# Patient Record
Sex: Male | Born: 1980 | Race: White | Hispanic: No | Marital: Single | Smoking: Former smoker
Health system: Southern US, Community
[De-identification: ages and names within clinical notes are randomized; demographics above are authoritative.]

## PROBLEM LIST (undated history)

## (undated) DIAGNOSIS — F3181 Bipolar II disorder: Secondary | ICD-10-CM

## (undated) DIAGNOSIS — G4730 Sleep apnea, unspecified: Secondary | ICD-10-CM

## (undated) DIAGNOSIS — F909 Attention-deficit hyperactivity disorder, unspecified type: Secondary | ICD-10-CM

## (undated) DIAGNOSIS — H5440 Blindness, one eye, unspecified eye: Secondary | ICD-10-CM

## (undated) HISTORY — PX: EYE SURGERY: SHX253

## (undated) HISTORY — PX: SPINE SURGERY: SHX786

---

## 2008-11-08 HISTORY — PX: INCISE AND DRAIN ABCESS: PRO64

## 2011-08-17 ENCOUNTER — Emergency Department (HOSPITAL_COMMUNITY)
Admission: EM | Admit: 2011-08-17 | Discharge: 2011-08-18 | Disposition: A | Discharge status: Home / Self Care | Payer: Managed Care, Other (non HMO) | Attending: Emergency Medicine | Admitting: Emergency Medicine

## 2011-08-17 DIAGNOSIS — K089 Disorder of teeth and supporting structures, unspecified: Secondary | ICD-10-CM | POA: Insufficient documentation

## 2011-08-17 DIAGNOSIS — F319 Bipolar disorder, unspecified: Secondary | ICD-10-CM | POA: Insufficient documentation

## 2013-01-25 ENCOUNTER — Telehealth: Payer: Self-pay | Admitting: Neurology

## 2013-01-25 NOTE — Telephone Encounter (Signed)
Called the patient to schedule follow up appt with you as requested to go over sleep study results.  He is concerned about having an additional copayment.  Did let him know that no significant sleep apnea was found based on the results.  Are there any recommendations that you can make for him that would not require him to come into the office?

## 2013-01-29 NOTE — Telephone Encounter (Signed)
Please call patient and advised him that it is always better to come in and discuss sleep study findings face to face. He did have some periodic leg movements of sleep and may benefit from treatment. We can delay his appointment until he is able to come in if he wishes, I do not believe he needs to come in urgently but would like for him to come in eventually. Please relay back the patient, thanks Mitchell Ray

## 2013-02-01 ENCOUNTER — Encounter: Payer: Self-pay | Admitting: Neurology

## 2015-05-19 ENCOUNTER — Emergency Department (HOSPITAL_COMMUNITY)
Admission: EM | Admit: 2015-05-19 | Discharge: 2015-05-19 | Discharge status: Against Medical Advice | Payer: Managed Care, Other (non HMO) | Attending: Emergency Medicine | Admitting: Emergency Medicine

## 2015-05-19 ENCOUNTER — Encounter (HOSPITAL_COMMUNITY): Payer: Self-pay | Admitting: *Deleted

## 2015-05-19 DIAGNOSIS — Z720 Tobacco use: Secondary | ICD-10-CM | POA: Insufficient documentation

## 2015-05-19 DIAGNOSIS — R200 Anesthesia of skin: Secondary | ICD-10-CM | POA: Insufficient documentation

## 2015-05-19 DIAGNOSIS — H5441 Blindness, right eye, normal vision left eye: Secondary | ICD-10-CM | POA: Insufficient documentation

## 2015-05-19 HISTORY — DX: Blindness, one eye, unspecified eye: H54.40

## 2015-05-19 HISTORY — DX: Bipolar II disorder: F31.81

## 2015-05-19 HISTORY — DX: Attention-deficit hyperactivity disorder, unspecified type: F90.9

## 2015-05-19 NOTE — ED Notes (Signed)
Pt reports left finger numbness 3-4 months ago, right arm numbness and difficulty walking about a month ago. Pt denies any loss of motor function but reports some difficulty typing with left hand. Pt states when he laughs hard, coughs, picks up objects his arms have a shooting pain in his arms. Pt denies any recent injury to arms or legs. Pt states he feels like his right leg is dragging behind him.

## 2015-05-21 ENCOUNTER — Emergency Department (HOSPITAL_COMMUNITY)
Admission: EM | Admit: 2015-05-21 | Discharge: 2015-05-21 | Disposition: A | Discharge status: Home / Self Care | Payer: Self-pay | Attending: Emergency Medicine | Admitting: Emergency Medicine

## 2015-05-21 ENCOUNTER — Emergency Department (HOSPITAL_COMMUNITY): Payer: Self-pay

## 2015-05-21 ENCOUNTER — Encounter (HOSPITAL_COMMUNITY): Payer: Self-pay | Admitting: Cardiology

## 2015-05-21 DIAGNOSIS — R531 Weakness: Secondary | ICD-10-CM | POA: Insufficient documentation

## 2015-05-21 DIAGNOSIS — R269 Unspecified abnormalities of gait and mobility: Secondary | ICD-10-CM | POA: Insufficient documentation

## 2015-05-21 DIAGNOSIS — Z8659 Personal history of other mental and behavioral disorders: Secondary | ICD-10-CM | POA: Insufficient documentation

## 2015-05-21 DIAGNOSIS — R202 Paresthesia of skin: Secondary | ICD-10-CM | POA: Insufficient documentation

## 2015-05-21 DIAGNOSIS — Z720 Tobacco use: Secondary | ICD-10-CM | POA: Insufficient documentation

## 2015-05-21 DIAGNOSIS — H5441 Blindness, right eye, normal vision left eye: Secondary | ICD-10-CM | POA: Insufficient documentation

## 2015-05-21 DIAGNOSIS — Z791 Long term (current) use of non-steroidal anti-inflammatories (NSAID): Secondary | ICD-10-CM | POA: Insufficient documentation

## 2015-05-21 LAB — BASIC METABOLIC PANEL
ANION GAP: 6 (ref 5–15)
BUN: 16 mg/dL (ref 6–20)
CO2: 27 mmol/L (ref 22–32)
Calcium: 9.6 mg/dL (ref 8.9–10.3)
Chloride: 106 mmol/L (ref 101–111)
Creatinine, Ser: 1 mg/dL (ref 0.61–1.24)
GFR calc Af Amer: >60 mL/min (ref >60)
GLUCOSE: 100 mg/dL — AB (ref 65–99)
POTASSIUM: 4.5 mmol/L (ref 3.5–5.1)
SODIUM: 139 mmol/L (ref 135–145)

## 2015-05-21 LAB — CBC
HCT: 45.1 % (ref 39.0–52.0)
HEMOGLOBIN: 15.7 g/dL (ref 13.0–17.0)
MCH: 31.7 pg (ref 26.0–34.0)
MCHC: 34.8 g/dL (ref 30.0–36.0)
MCV: 90.9 fL (ref 78.0–100.0)
Platelets: 215 10*3/uL (ref 150–400)
RBC: 4.96 MIL/uL (ref 4.22–5.81)
RDW: 13.5 % (ref 11.5–15.5)
WBC: 9.9 10*3/uL (ref 4.0–10.5)

## 2015-05-21 NOTE — ED Notes (Signed)
Pt is in stable condition upon d/c and ambulates from ED. 

## 2015-05-21 NOTE — ED Provider Notes (Signed)
CSN: [id number]     Arrival date & time 05/21/15  0759 History   First MD Initiated Contact with Patient 05/21/15 [phone]     Chief Complaint  Patient presents with  . Numbness     (Consider location/radiation/quality/duration/timing/severity/associated sxs/prior Treatment) The history is provided by the patient. No language interpreter was used.  Mr. Whorley is a 34yo M with PMH of ADHD and bipolar disorder, type 2 who presents with a 46-month history of L hand numbness and R upper arm pain as well as a 18-month history of R leg weakness. He has constant paraesthesias in the ulnar distribution of his L hand associated with hyperalgesia, exacerbated by typing but no associated weakness. When he is typing for an extended period of time, he also has intermittent paraesthesias in his R thumb and a sharp pain in his R deltoid 'like someone punched him', both of which are relieved by rest. He says that the only possible inciting incident was 4 months ago when he lifted a heavy object at work, after which both of his hands went numb, but subsided after a couple of days spontaneously before returning in his L hand a month later. He also notes intermittent weakness in his R leg over the past month that only occurs after he has walked over a short distance, but not at rest. He denies any pain, numbness or paraesthesias in his leg and his L leg is completely normal. Identifiable stressors include his job and starting college in a month. He denies any other symptoms at this time.  Past Medical History  Diagnosis Date  . Bipolar 2 disorder   . ADHD (attention deficit hyperactivity disorder)   . Blind right eye    History reviewed. No pertinent past surgical history. History reviewed. No pertinent family history. History  Substance Use Topics  . Smoking status: Current Every Day Smoker -- 1.00 packs/day    Types: Cigarettes  . Smokeless tobacco: Never Used  . Alcohol Use: Yes    Review of Systems   Constitutional: Negative for fever and chills.  HENT: Negative.  Negative for mouth sores and sore throat.   Eyes: Negative for visual disturbance.  Respiratory: Negative for cough and shortness of breath.   Cardiovascular: Negative for chest pain.  Gastrointestinal: Negative for nausea, vomiting and abdominal pain.  Endocrine: Negative for polyuria.  Genitourinary: Negative for dysuria and flank pain.  Musculoskeletal: Positive for gait problem. Negative for myalgias and back pain.  Skin: Negative for rash.  Neurological: Positive for weakness and numbness. Negative for dizziness, tremors, seizures, syncope, facial asymmetry, speech difficulty, light-headedness and headaches.  Hematological: Does not bruise/bleed easily.  Psychiatric/Behavioral: Negative for suicidal ideas, behavioral problems and agitation.  All other systems reviewed and are negative.     Allergies  Dilaudid  Home Medications   Prior to Admission medications   Medication Sig Start Date End Date Taking? Authorizing Provider  meloxicam (MOBIC) 7.5 MG tablet Take 7.5 mg by mouth daily. 04/15/15   Historical Provider, MD   BP 130/85 mmHg  Pulse 69  Temp(Src) 98.6 F (37 C) (Oral)  Resp 15  SpO2 99% Physical Exam  Constitutional: He is oriented to person, place, and time. He appears well-developed and well-nourished. No distress.  HENT:  Head: Normocephalic and atraumatic.  Mouth/Throat: Oropharynx is clear and moist. No oropharyngeal exudate.  Eyes: Conjunctivae are normal. Pupils are equal, round, and reactive to light.  Neck: Normal range of motion. Neck supple.  Cardiovascular: Normal rate, regular  rhythm, normal heart sounds and intact distal pulses.  Exam reveals no gallop and no friction rub.   No murmur heard. Pulmonary/Chest: Effort normal and breath sounds normal. No respiratory distress. He has no wheezes.  Abdominal: Soft. He exhibits no distension and no mass. There is no tenderness. There is no  rebound and no guarding.  Musculoskeletal: Normal range of motion. He exhibits no edema or tenderness.  Neurological: He is alert and oriented to person, place, and time.  Decreased sensation in the ulnar nerve distribution of the L hand. Normal sensation in the RUE and bilateral lower extremities. Normal strength and coordination in upper and lower extremities bilaterally. Tinel and phalen's tests negative. Normal gait.  Skin: Skin is warm and dry. He is not diaphoretic.  Psychiatric: He has a normal mood and affect. His speech is normal and behavior is normal. Judgment and thought content normal. Cognition and memory are normal.  Patient is tearful discussing his symptoms but denies depressed mood and denies SI/HI.  Nursing note and vitals reviewed.   ED Course  Procedures (including critical care time) Labs Review Labs Reviewed  BASIC METABOLIC PANEL - Abnormal; Notable for the following:    Glucose, Bld 100 (*)    All other components within normal limits  CBC    Imaging Review Ct Head Wo Contrast  05/21/2015   CLINICAL DATA:  Left finger numbness 3-4 months ago. Right arm numbness. Difficulty walking.  EXAM: CT HEAD WITHOUT CONTRAST  TECHNIQUE: Contiguous axial images were obtained from the base of the skull through the vertex without intravenous contrast.  COMPARISON:  None.  FINDINGS: The brainstem, cerebellum, cerebral peduncles, thalamus, basal ganglia, basilar cisterns, and ventricular system appear within normal limits. No intracranial hemorrhage, mass lesion, or acute CVA.  Polypoid mucoperiosteal thickening is present in both maxillary sinuses.  IMPRESSION: 1. No significant intracranial abnormalities are identified. 2. Chronic bilateral maxillary sinusitis.   Electronically Signed   By: Van Clines M.D.   On: 05/21/2015 10:27     EKG Interpretation   Date/Time:  Wednesday May 21 2015 09:46:26 EDT Ventricular Rate:  78 PR Interval:  159 QRS Duration: 94 QT  Interval:  372 QTC Calculation: 424 R Axis:   19 Text Interpretation:  Sinus rhythm Low voltage, precordial leads Confirmed  by HARRISON  MD, [place] ([id number]) on 05/21/2015 10:05:48 AM      MDM   Final diagnoses:  Paresthesias    Mr. Davidow is a 34yo with PMH of BPD type 2 and ADHD who presents with  a 32-month history of L hand numbness and R upper arm pain as well as a 47-month history of R leg subjective numbness/paraesthesias, most likely 2/2 overuse. With the distribution of neurologic symptoms in his RLL, we are doing a CT head non-contrast to r/o possible CVA. We are also checking CBC and BMP to evaluate for laboratory disturbances that could explain his symptoms. Will refer to [facility] for outpatient follow-up.    Norval Gable, MD 05/21/15 [place], MD 05/23/15 [phone]

## 2015-05-21 NOTE — Discharge Instructions (Signed)
°Emergency Department Resource Guide °1) Find a Doctor and Pay Out of Pocket °Although you won't have to find out who is covered by your insurance plan, it is a good idea to ask around and get recommendations. You will then need to call the office and see if the doctor you have chosen will accept you as a new patient and what types of options they offer for patients who are self-pay. Some doctors offer discounts or will set up payment plans for their patients who do not have insurance, but you will need to ask so you aren't surprised when you get to your appointment. ° °2) Contact Your Local Health Department °Not all health departments have doctors that can see patients for sick visits, but many do, so it is worth a call to see if yours does. If you don't know where your local health department is, you can check in your phone book. The CDC also has a tool to help you locate your state's health department, and many state websites also have listings of all of their local health departments. ° °3) Find a Walk-in Clinic °If your illness is not likely to be very severe or complicated, you may want to try a walk in clinic. These are popping up all over the country in pharmacies, drugstores, and shopping centers. They're usually staffed by nurse practitioners or physician assistants that have been trained to treat common illnesses and complaints. They're usually fairly quick and inexpensive. However, if you have serious medical issues or chronic medical problems, these are probably not your best option. ° °No Primary Care Doctor: °- Call [facility] at  832-8000 - they can help you locate a primary care doctor that  accepts your insurance, provides certain services, etc. °- Physician Referral Service- 1-800-533-3463 ° °Chronic Pain Problems: °Organization         Address  Phone   Notes  °[facility]  (336) 297-2271 Patients need to be referred by their primary care doctor.  ° °Medication  Assistance: °Organization         Address  Phone   Notes  °[place] [facility] [address] °[place], [place] [address] (336) 641-8030 --Must be a resident of [place] °-- Must have NO insurance coverage whatsoever (no Medicaid/ Medicare, etc.) °-- The pt. MUST have a primary care doctor that directs their care regularly and follows them in the community °  °[facility]  (866) 331-1348   °[employer]  (888) 892-1162   ° °Agencies that provide inexpensive medical care: °Organization         Address  Phone   Notes  °[facility]  (336) 832-8035   °[facility]    (336) 832-7272   °[facility] [address] °[place], [place] [address] (336) 832-4777   °[facility] [address], °[place] (336) 271-4999   °[facility]    (336) 373-0678   °[facility]    (336) 272-1050   °[facility] ° [address], [place] Phone:  (336) 832-4444, Fax:  (336) 832-4440 Hours of Operation:  9 am - 6 pm, M-F.  Also accepts Medicaid/Medicare and self-pay.  °[facility] ° [address], [place] Phone: (336) 832-3150, Fax: (336) 832-3151. Hours of Operation:  8:30 am - 5:30 pm, M-F.  Also accepts Medicaid and self-pay.  °[facility] [place] [address], [place] Phone: (336) 878-6027   °[facility] [address], [place], [place] (336)723-1848, Ext. 123 Mondays & Thursdays: 7-9 AM.  First 15 patients are seen on a first come, first serve basis. °  ° °Medicaid-accepting [place] Providers: ° °Organization         Address  Phone   Notes  °[facility] [address], [place] (336) 641-2100 Also accepts self-pay patients.  °[facility] [address], [place] ° (336) 856-9996   °[facility] [address], [place]  (336) 288-8857   °[facility] [address] [place] [address], [place] (336) 299-7000   °Veita Bland [address], [place]  ° (336) 373-1557 Only accepts [place] Access Medicaid patients after they have their name applied to their card.  ° °Self-Pay (no insurance) in [place]: ° °Organization         Address  Phone   Notes  °Sickle Cell Patients, [facility] [address], [place] (336) 832-1970   °[facility] [address], [place] (336) 832-4400   °[facility] ° [address],  (336) 992-4800   °[facility]/Dr. Osei-Bonsu ° [address] [place] [address], [place] or [address], [place] (336) 841-8500 Phone number for both [place] and [place] locations is the same.  °[facility] [address], [place] (336) 299-0000   °[facility] [address] [place] [address], [place] or [address] (336) 852-7530 °(336) 878-2260   °[facility] [address], [place] (336) 350-1642, phone; (336) 294-5005, fax Sees patients 1st and 3rd Saturday of every month.  Must not qualify for public or private insurance (i.e. Medicaid, Medicare, [place] Health Choice, Veterans' Benefits) • Household income should be no more than 200% of the poverty level •The clinic cannot treat you if you are pregnant or think you are pregnant • Sexually transmitted diseases are not treated at the clinic.  ° ° °Dental Care: °Organization         Address  Phone  Notes  °[place] [facility] [address], [place] (336) 641-6152 Accepts children up to age 21 who are enrolled in Medicaid or [place] Health Choice; pregnant women with a Medicaid card; and children who have applied for Medicaid or [place] Health Choice, but were declined, whose parents can pay a reduced fee at time of service.  °[place]  [facility] [place]  [address], [place] (336) 641-7733 Accepts children up to age 21 who are enrolled in Medicaid or [place] Health Choice; pregnant women with a Medicaid card; and children who have applied for Medicaid or  Health Choice, but were declined, whose parents can pay a reduced fee at time of service.  °[facility] ° [address], [place] (336) 641-4533 Patients are seen by appointment only. Walk-ins are not accepted. [facility] will see patients 18 years of age and older. °Monday - Tuesday (8am-5pm) °Most Wednesdays (8:30-5pm) °$30 per visit, cash only  °[facility] ° [address], [place] (336) 641-4533 Patients are seen by appointment only. Walk-ins are not accepted. [facility] will see patients 18 years of age and older. °One   Wednesday Evening (Monthly: Volunteer Based).  $30 per visit, cash only  °[facility]  (919) 537-3737 for adults; Children under age 4, call Graduate Pediatric Dentistry at (919) [phone]. Children aged 4-14, please call (919) [phone] to request a pediatric application. ° Dental services are provided in all areas of dental care including fillings, crowns and bridges, complete and partial dentures, implants, gum treatment, root canals, and extractions. Preventive care is also provided. Treatment is provided to both adults and children. °Patients are selected via a lottery and there is often a waiting list. °  °[facility] [address], °[place] ° (336) 763-8833 [email or web address] °  °[facility] [address], [place], [place] (336)723-1848, Ext. 123 Second and Fourth Thursday of each month, opens at 6:30 AM; Clinic ends at 9 AM.  Patients are seen on a first-come first-served basis, and a limited number are seen during each clinic.  ° °[facility] ° [address], [place], [place] (336) 723-7904    Eligibility Requirements °You must have lived in [place], [place], or [place] counties for at least the last three months. °  You cannot be eligible for state or federal sponsored healthcare insurance, including Veterans Administration, Medicaid, or Medicare. °  You generally cannot be eligible for healthcare insurance through your employer.  °  How to apply: °Eligibility screenings are held every Tuesday and Wednesday afternoon from 1:00 pm until 4:00 pm. You do not need an appointment for the interview!  °[facility] [address], [place], [place] 336-631-2330   °[facility] [facility]  336-342-8273   °[facility]  336-703-3100   °[facility]  336-570-6415   ° °Behavioral Health Resources in the Community: °Intensive Outpatient Programs °Organization         Address  Phone  Notes  °[facility] [address], [place], [place] 336-878-6098   °[facility] [address], [place], [place] 336-832-9800   °[facility] [address], [place], [place] ° 336-882-2125   °[facility] [address],  °[place], [place] 1-800-853-5163 or 336-641-4981   °Substance Abuse Resources °Organization         Address  Phone  Notes  °[facility]  336-882-2125   °[facility]  336-784-9470   °[facility]  336-285-9073   °[facility]  336-845-3988   °Residential & Outpatient Substance Abuse Program  1-800-659-3381   °Psychological Services °Organization         Address  Phone  Notes  °[facility]  336- 832-9600   °[facility]  336- 378-7881   °[facility] [address], [place] 1-800-853-5163 or 336-641-4981   ° °Mobile Crisis Teams °Organization         Address  Phone  Notes  °[facility], Mobile Crisis Care Unit  1-877-626-1772   °Assertive °[facility] ° [address]  [place], [place] 336-834-9664   °Sharon DeEsch [address] °[place] 336-554-5454   ° °Self-Help/Support Groups °Organization         Address  Phone             Notes  °[facility] [place] - variety of support groups  336- 373-1402 Call for more information  °Narcotics Anonymous (NA), [facility] [address], °[place]  2 meetings at this location  ° °  Residential Treatment Programs °Organization         Address  Phone  Notes  °[facility] [address],    °[place]  1-866-801-8205   °[facility] ° [address], [place], [place] 704-293-8524   °[facility] [address], [place] 336-845-3988 Admissions: 8am-3pm M-F  °[facility] [address]    °[place], [place] 336-841-1104   °[facility] [address], [place], [place] 336-379-7146   °[facility] [address]  °[place], [place] 336-285-9073   °[facility] - Intensive Outpatient [address], [place], [place] 336-852-3033   °[facility] [address]  °[place], [place] 1-877-615-2722 or 336-784-9470   °[facility] [address] [place], [place] 336-227-7417 Accepts Medicaid  °[facility] [address]  ° [place] 1-800-659-3381 Substance Abuse/Addiction Treatment  ° °[place] Behavioral Health Resources °Organization         Address  Phone  Notes  °[facility]  (888) 581-9988   °Julie Brannon, PhD [address] [place], [place]   (336) 349-5553 or (336) 951-0000   °[facility]   [address] °[place], [place] (336) 349-4454   °[facility] [address], [place], [place] (336) 342-8316 Insurance/Medicaid/sponsorship through [facility]  °[facility] [address]                                    [place], [place] (336) 342-8316 Therapy/tele-psych/case    °[facility] [address]  ° [place], [place] (336) 349-2233    °Dr. Arfeen  (336) 349-4544   °[facility] [place]  [employer] [place] [facility] 1) [address], [place] °2) [address], [place] °3)  [address], [place] (336) 349-3220 °(336) 342-7768 ° °(336) 342-8140   °[place] [facility] (336) 342-1394 or (336) 342-3537 (After Hours)    ° ° °

## 2015-05-21 NOTE — ED Notes (Signed)
Pt reports he has had numbness in the left hand on the medial side for the past 3-4 months. States that he has had pain in right upper arm. Weakness reported to the right leg.

## 2015-12-26 ENCOUNTER — Other Ambulatory Visit (INDEPENDENT_AMBULATORY_CARE_PROVIDER_SITE_OTHER): Payer: BLUE CROSS/BLUE SHIELD

## 2015-12-26 ENCOUNTER — Encounter: Payer: Self-pay | Admitting: Neurology

## 2015-12-26 ENCOUNTER — Ambulatory Visit (INDEPENDENT_AMBULATORY_CARE_PROVIDER_SITE_OTHER): Payer: BLUE CROSS/BLUE SHIELD | Admitting: Neurology

## 2015-12-26 VITALS — BP 130/70 | HR 88 | Ht 72.0 in | Wt 295.0 lb

## 2015-12-26 DIAGNOSIS — R292 Abnormal reflex: Secondary | ICD-10-CM | POA: Diagnosis not present

## 2015-12-26 DIAGNOSIS — G959 Disease of spinal cord, unspecified: Secondary | ICD-10-CM | POA: Diagnosis not present

## 2015-12-26 DIAGNOSIS — R261 Paralytic gait: Secondary | ICD-10-CM | POA: Diagnosis not present

## 2015-12-26 DIAGNOSIS — R202 Paresthesia of skin: Secondary | ICD-10-CM

## 2015-12-26 DIAGNOSIS — R258 Other abnormal involuntary movements: Secondary | ICD-10-CM | POA: Diagnosis not present

## 2015-12-26 LAB — BASIC METABOLIC PANEL
BUN: 13 mg/dL (ref 6–23)
CALCIUM: 9.3 mg/dL (ref 8.4–10.5)
CO2: 28 mEq/L (ref 19–32)
Chloride: 107 mEq/L (ref 96–112)
Creatinine, Ser: 1.06 mg/dL (ref 0.40–1.50)
GFR: 84.45 mL/min (ref >60.00)
Glucose, Bld: 103 mg/dL — ABNORMAL HIGH (ref 70–99)
POTASSIUM: 3.8 meq/L (ref 3.5–5.1)
SODIUM: 141 meq/L (ref 135–145)

## 2015-12-26 MED ORDER — BACLOFEN 10 MG PO TABS: ORAL_TABLET | ORAL | Status: DC

## 2015-12-26 NOTE — Patient Instructions (Addendum)
1.  Start baclofen 5mg  (half-tablet) at bedtime for one week, then increase to 1 tablet at bedtime.  Stay at the the lower dose if you are sleepy on the medication. 2.  MRI cervical spine wwo contrast 3.  Check blood work

## 2015-12-26 NOTE — Progress Notes (Signed)
[facility] Clinic Note - Initial Visit   Date: 12/26/2015  Mitchell Ray MRN: [id number] DOB: May 05, 1981   Dear Dr.  Ernie Hew:  Thank you for your kind referral of Mitchell Ray for consultation of generalized paresthesias and gait abnormality. Although his history is well known to you, please allow [place] to reiterate it for the purpose of our medical record. The patient was accompanied to the clinic by self.    History of Present Illness: Mitchell Ray is a 35 y.o. right-handed Caucasian male with vitamin D deficiency, congenital amblyopia of the right eye, current tobacco user, bipolar disease, and ADHD presenting for evaluation of hand paresthesias, gait abnormality, and falls.    In March 2016, he developed numbness/tingling over the last two fingers on the left and two weeks later, developed the same symptoms on the right hand.  Around the same time, he developed right leg limping.  Whenever he is stressed or nervous, he is unable to "control" his right leg, described as stiffness and states that the knees will not bend.  There is no pain. He reports having 5 falls in January.  He has been using a cane since September 2016 because of hip pain. In November, he started having cold sensation over the left upper thigh.  In December, he woke up with numbness over the thighs, inner thighs, and feet which has been constant.  Soaking his hands in hot water would improve his achy pain of the the hands.  He has numbness of the right arm, especially when talking on the phone.  He has an electrical impulse which travels down his body, which is triggered by laugher, lifting, and coughing which has been ongoing.  He has twitching and shaking of the hands especially in certain positions.  No history of neck trauma. No bowel/bladder incontinence.  In July 2016, he went to the emergency department for left upper arm pain.  He also complains of low back pain and right upper neck  pain.  Because he did not get health insurance until this year, he did not have a PCP until now.    Out-side paper records, electronic medical record, and images have been reviewed where available and summarized as:  Labs 11/25/2015:  Vitamin D 14*, TSH 3.480, ferritin 369,    CT head wo contrast 05/21/2015:  1. No significant intracranial abnormalities are identified.  2. Chronic bilateral maxillary sinusitis.   Past Medical History  Diagnosis Date  . Bipolar 2 disorder ([place])   . ADHD (attention deficit hyperactivity disorder)   . Blind right eye     Past Surgical History  Procedure Laterality Date  . Eye surgery      as a child  . Incise and drain abcess  2010     Medications:  Outpatient Encounter Prescriptions as of 12/26/2015  Medication Sig Note  . cholecalciferol (VITAMIN D) 1000 units tablet Take 5,000 Units by mouth daily.   . [DISCONTINUED] meloxicam (MOBIC) 7.5 MG tablet Take 7.5 mg by mouth daily. 05/19/2015: .    No facility-administered encounter medications on file as of 12/26/2015.     Allergies:  Allergies  Allergen Reactions  . Dilaudid [Hydromorphone Hcl] Itching   Family History: Family History  Problem Relation Age of Onset  . Cancer Mother   . Healthy Father   . Diabetes Mellitus II Mother   . Hyperlipidemia Mother   . Hypertension Mother   . Healthy Brother     Social History: Social History  Substance Use Topics  . Smoking status: Current Every Day Smoker -- 1.00 packs/day for 10 years    Types: Cigarettes  . Smokeless tobacco: Never Used     Comment: Trying to quit, down to 5 cigarettes daily  . Alcohol Use: 0.0 oz/week    0 Standard drinks or equivalent per week     Comment: rarely   Social History   Social History Narrative   He works part-time at [employer]   He is a Charity fundraiser at [employer]   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     Review of Systems:  CONSTITUTIONAL: No fevers, chills, night sweats, or  weight loss.   EYES: No visual changes or eye pain ENT: No hearing changes.  No history of nose bleeds.   RESPIRATORY: No cough, wheezing and shortness of breath.   CARDIOVASCULAR: Negative for chest pain, and palpitations.   GI: Negative for abdominal discomfort, blood in stools or black stools.  No recent change in bowel habits.   GU:  No history of incontinence.   MUSCLOSKELETAL: No history of joint pain or swelling.  No myalgias.   SKIN: Negative for lesions, rash, and itching.   HEMATOLOGY/ONCOLOGY: Negative for prolonged bleeding, bruising easily, and swollen nodes.  No history of cancer.   ENDOCRINE: Negative for cold or heat intolerance, polydipsia or goiter.   PSYCH:  +depression or anxiety symptoms.   NEURO: As Above.   Vital Signs:  BP 130/70 mmHg  Pulse 88  Ht 6' (1.829 m)  Wt 295 lb (133.811 kg)  BMI 40.00 kg/m2   General Medical Exam:   General:  Well appearing, comfortable.   Eyes/ENT: see cranial nerve examination.   Neck: No masses appreciated.  Full range of motion without tenderness.  No carotid bruits. Respiratory:  Clear to auscultation, good air entry bilaterally.   Cardiac:  Regular rate and rhythm, no murmur.   Extremities:  No deformities, edema, or skin discoloration.  Skin:  No rashes or lesions.  Neurological Exam: MENTAL STATUS including orientation to time, place, person, recent and remote memory, attention span and concentration, language, and fund of knowledge is normal.  Speech is not dysarthric.  CRANIAL NERVES: II:  No visual field defects.  Unremarkable fundi.   III-IV-VI: Pupils equal round and reactive to light.  Normal conjugate, extra-ocular eye movements in all directions of gaze, except severe right amblyopia.  No nystagmus.  No ptosis.   V:  Normal facial sensation.  Jaw jerk is absent.   VII:  Normal facial symmetry and movements.  No pathologic facial reflexes.  VIII:  Normal hearing and vestibular function.   IX-X:  Normal palatal  movement.   XI:  Normal shoulder shrug and head rotation.   XII:  Normal tongue strength and range of motion, no deviation or fasciculation.  MOTOR:  Mild atrophy of the left intrinsic hand muscles and clawing of the hand.  No fasciculations or abnormal movements.  No pronator drift.   Right Upper Extremity:    Left Upper Extremity:    Deltoid  5/5   Deltoid  5/5   Biceps  5/5   Biceps  5/5   Triceps  5/5   Triceps  5/5   Wrist extensors  5/5   Wrist extensors  5/5   Wrist flexors  5/5   Wrist flexors  5/5   Finger extensors  5/5   Finger extensors  5/5   Finger flexors  5/5   Finger flexors  5/5   Dorsal interossei  5/5   Dorsal interossei  4/5   Abductor pollicis  5/5   Abductor pollicis  5/5   Tone (Ashworth scale)  0+  Tone (Ashworth scale)  0   Right Lower Extremity:    Left Lower Extremity:    Hip flexors  5/5   Hip flexors  5/5   Hip extensors  5/5   Hip extensors  5/5   Knee flexors  5/5   Knee flexors  5/5   Knee extensors  5/5   Knee extensors  5/5   Dorsiflexors  5/5   Dorsiflexors  5/5   Plantarflexors  5/5   Plantarflexors  5/5   Toe extensors  5/5   Toe extensors  5/5   Toe flexors  5/5   Toe flexors  5/5   Tone (Ashworth scale)  1+  Tone (Ashworth scale)  0+   MSRs:  Right                                                                 Left brachioradialis 3+  brachioradialis 3+  biceps 3+  biceps 3+  triceps 3+  triceps 3+  patellar 4+  patellar 4+  ankle jerk 3+  ankle jerk 3+  Hoffman yes  Hoffman yes  plantar response up  plantar response down  Sustained ankle clonus R >> L Sustained hand clonus R >> L  SENSORY:  Vibration, light touch, and pin prick is significantly reduced in the lower extremities R > L. PP and temperature reduced over the C8 dermatome.  No sensory level.  COORDINATION/GAIT: Normal finger-to- nose-finger and heel-to-shin.  Slowed finger tapping (R >L) and markedly slowed toe tapping on the right.  Severely spastic gait with  circumduction of the right leg.      IMPRESSION: Mr. Grzeskowiak is a 36 year-old gentleman referred for evaluation of generalized paresthesias and gait abnormality.  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 There is no sensory level, but his sensory disturbance involving the C8 dermatome and weakness in this region is concerning.  Structural lesion of the cervical spine needs to be evaluated for.  MRI cervical spine wwo contrast ASAP.  Creatinine will be checked as this will be contrasted study.   For his spasticity, recommend starting baclofen 5mg  at bedtime x 1 week, then increase to 10mg  at bedtime.   Fall precautions discussed, continue using cane for now.  PT discussed, but prefer to expedite work-up at this time  Further recommendations will be based on his imaging results   The duration of this appointment visit was 50 minutes of face-to-face time with the patient.  Greater than 50% of this time was spent in counseling, explanation of diagnosis, planning of further management, and coordination of care.   Thank you for allowing me to participate in patient's care.  If I can answer any additional questions, I would be pleased to do so.    Sincerely,    Donika K. Posey Pronto, DO

## 2015-12-29 ENCOUNTER — Telehealth: Payer: Self-pay | Admitting: Neurology

## 2015-12-29 NOTE — Telephone Encounter (Signed)
PT called in regards to MRI and making sure insurance has approved/Dawn CB# [phone] to leave message

## 2015-12-29 NOTE — Progress Notes (Signed)
Note routed

## 2015-12-30 NOTE — Telephone Encounter (Signed)
Called patient back and informed him that I will call him when I get the PA.

## 2015-12-31 ENCOUNTER — Telehealth: Payer: Self-pay | Admitting: Neurology

## 2015-12-31 ENCOUNTER — Ambulatory Visit
Admission: RE | Admit: 2015-12-31 | Discharge: 2015-12-31 | Disposition: A | Discharge status: Home / Self Care | Payer: BLUE CROSS/BLUE SHIELD | Source: Ambulatory Visit | Attending: Neurology | Admitting: Neurology

## 2015-12-31 ENCOUNTER — Other Ambulatory Visit: Payer: Self-pay | Admitting: Neurology

## 2015-12-31 DIAGNOSIS — G959 Disease of spinal cord, unspecified: Secondary | ICD-10-CM

## 2015-12-31 DIAGNOSIS — R258 Other abnormal involuntary movements: Secondary | ICD-10-CM

## 2015-12-31 DIAGNOSIS — Z139 Encounter for screening, unspecified: Secondary | ICD-10-CM

## 2015-12-31 DIAGNOSIS — R292 Abnormal reflex: Secondary | ICD-10-CM

## 2015-12-31 MED ORDER — GADOBENATE DIMEGLUMINE 529 MG/ML IV SOLN
20.0000 mL | Freq: Once | INTRAVENOUS | Status: AC | PRN
  Administered 2015-12-31: 20 mL via INTRAVENOUS

## 2015-12-31 NOTE — Telephone Encounter (Signed)
MRI cervical spine wwo contrast approved. Authorization # [id number], valid 2/22-3/23/2017.       Please try to expedite imaging.  Donasia Wimes K. Posey Pronto, DO

## 2015-12-31 NOTE — Telephone Encounter (Signed)
Patient is going for MRI today.

## 2016-01-01 ENCOUNTER — Telehealth: Payer: Self-pay | Admitting: Neurology

## 2016-01-01 NOTE — Telephone Encounter (Signed)
I attempted to contact patient via phone today regarding the results of MRI cervical spine, however he was not available and phone was answered by friend.  Message left with friend to return my call.  Shamanda Len K. Posey Pronto, DO

## 2016-01-02 ENCOUNTER — Telehealth: Payer: Self-pay | Admitting: Neurology

## 2016-01-02 ENCOUNTER — Inpatient Hospital Stay: Admission: RE | Admit: 2016-01-02 | Payer: BLUE CROSS/BLUE SHIELD | Source: Ambulatory Visit

## 2016-01-02 NOTE — Telephone Encounter (Signed)
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  Because his spinal cord abnormality corresponds to the same level of disc extrusion, unlikely to represent demyelinating disease.  I will refer the patient to neurosurgery for ASAP evaluation.  Warning signs for cord compression discussed with patient and instructed him to go to the ER, if he develops any.  Mitchell Pinales K. Posey Pronto, DO

## 2016-01-02 NOTE — Telephone Encounter (Signed)
Referral and notes sent

## 2016-01-17 ENCOUNTER — Encounter (HOSPITAL_COMMUNITY): Payer: Self-pay | Admitting: *Deleted

## 2016-01-17 ENCOUNTER — Emergency Department (HOSPITAL_COMMUNITY)
Admission: EM | Admit: 2016-01-17 | Discharge: 2016-01-17 | Disposition: A | Discharge status: Home / Self Care | Payer: BLUE CROSS/BLUE SHIELD | Attending: Emergency Medicine | Admitting: Emergency Medicine

## 2016-01-17 DIAGNOSIS — F17210 Nicotine dependence, cigarettes, uncomplicated: Secondary | ICD-10-CM | POA: Insufficient documentation

## 2016-01-17 DIAGNOSIS — R0602 Shortness of breath: Secondary | ICD-10-CM | POA: Insufficient documentation

## 2016-01-17 NOTE — ED Notes (Signed)
The pt is c/o difficulty breathing intermittently since yesterday.  He woke up tonight and felt like his throat was closing up on him.  He had cervical disc surgery this past Tuesday.  He is taking vicodin and baclofen    No visible swelling in the back of his throat but he feels like its lower

## 2016-03-18 ENCOUNTER — Ambulatory Visit: Payer: BLUE CROSS/BLUE SHIELD | Admitting: Physical Therapy

## 2016-03-25 ENCOUNTER — Ambulatory Visit: Payer: BLUE CROSS/BLUE SHIELD | Attending: Family Medicine | Admitting: Physical Therapy

## 2016-03-25 DIAGNOSIS — R2689 Other abnormalities of gait and mobility: Secondary | ICD-10-CM | POA: Diagnosis present

## 2016-03-25 DIAGNOSIS — M6281 Muscle weakness (generalized): Secondary | ICD-10-CM

## 2016-03-25 DIAGNOSIS — R262 Difficulty in walking, not elsewhere classified: Secondary | ICD-10-CM

## 2016-03-25 DIAGNOSIS — Z9181 History of falling: Secondary | ICD-10-CM | POA: Diagnosis present

## 2016-03-25 NOTE — Therapy (Signed)
[place], [place], [address] Phone: [phone]   Fax:  [phone]  Physical Therapy Evaluation  Patient Details  Name: Mitchell Ray MRN: [id number] Date of Birth: [birth year] Referring Provider: Rachell Cipro, MD  Encounter Date: 03/25/2016      PT End of Session - 03/25/16 1726    Visit Number 1   Number of Visits 13   Date for PT Re-Evaluation 05/06/16   Authorization Type BCBS 30 visit limit   Authorization - Visit Number 30   PT Start Time 1630   PT Stop Time 1705   PT Time Calculation (min) 35 min      Past Medical History  Diagnosis Date  . Bipolar 2 disorder ([place])   . ADHD (attention deficit hyperactivity disorder)   . Blind right eye     Past Surgical History  Procedure Laterality Date  . Eye surgery      as a child  . Incise and drain abcess  2010    There were no vitals filed for this visit.       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 ACDF C5-6 and 6-7 January 13 2016. Bilat ulnar stiffness in fingers. Reports no longer removing shoe to drive but feels as though his Right foot is becoming more numb. [place] [address] several times prior to surgery due to dragging R leg, has not fallen since but dragging gets worse with stress. No restrictions  fom MD.  Pt would like to not that pain is NOT a factor in his case, he is not having surgical pain in his neck or back.    Limitations Walking;House hold activities   Patient Stated Goals stairs, improve ambulation    Currently in Pain? No/denies            [facility] PT Assessment - 03/25/16 0001    Assessment   Medical Diagnosis S/P C5-6,6-7 ACDF   Referring Provider Rachell Cipro, MD   Onset Date/Surgical Date 01/13/16   Prior Therapy no   Precautions   Precautions None   Restrictions   Weight Bearing Restrictions No   Balance Screen   Has the patient fallen in the past  6 months Yes   How many times? multiple   [place] residence   Type of [place] to enter   Entrance Stairs-Number of Steps 3   Prior Function   Level of Independence Independent with community mobility with device;Independent with basic ADLs   Vocation Student   Cognition   Overall Cognitive Status Within Functional Limits for tasks assessed   Observation/Other Assessments   Focus on Therapeutic Outcomes (FOTO)  46% impairment   ROM / Strength   AROM / PROM / Strength Strength   Strength   Strength Assessment Site Shoulder;Hip;Knee;Wrist;Elbow;Ankle   Right/Left Shoulder Right;Left   Right Shoulder Flexion 4/5   Right Shoulder External Rotation 4-/5   Left Shoulder Flexion 5/5   Left Shoulder External Rotation 4-/5   Right/Left Elbow Right;Left   Right Elbow Extension 4/5   Left Elbow Extension 4/5   Right/Left Wrist --  finger abduction   Right/Left Hip Right;Left   Right Hip Flexion 3+/5   Right Hip ABduction 4-/5   Left Hip Extension 4+/5   Left Hip ABduction 4/5   Right/Left Knee Right;Left   Right Knee Flexion 3+/5   Right Knee  Extension 4/5   Left Knee Flexion 5/5   Left Knee Extension 5/5   Right/Left Ankle Right;Left   Right Ankle Dorsiflexion 4-/5   Right Ankle Inversion 3+/5   Ambulation/Gait   Assistive device Straight cane   Gait Comments foot flat bilaterally, maintains R knee ext with circumduction, short step length, lacking trunk rotation and arm swing                           PT Education - 03/25/16 1725    Education provided Yes   Education Details neural input and compression of proximal spine to affect body; transfer care to neuro specialty; practicing of steps safely by tapping to challenge endurance.    Person(s) Educated Patient   Methods Explanation   Comprehension Verbalized understanding             PT Long Term Goals - 03/25/16 1734    PT LONG TERM  GOAL #1   Title pt will be able to ambulate while carrying an object using bilat upper extremities   Baseline pt reports he would fall over if he were to hold something in his hands while standing   Time 6   Period Weeks   Status New   PT LONG TERM GOAL #2   Title Pt will be able to climb steps safely without catching R foot on step.    Time 6   Period Weeks   Status New   PT LONG TERM GOAL #3   Title Pt will be able to safely ambulate through a crowd and navigate obstacles    Baseline pt reports he gets very nervous and gait worsens   Time 6   Period Weeks   Status New   PT LONG TERM GOAL #4   Title Pt will verbalize improved endurance in walking during daily activities, i.e. decreased dragging of right foot.    Time 6   Period Weeks   Status New               Plan - 03/25/16 1727    Clinical Impression Statement Pt presents to physical therapy following ACDF of C5-6 and 6-7 in March. Pt reports seeing physician when noticing an involuntary limp in RLE that was not due to pain as well as discomfort in L 3rd and 4th digits. Walking has progressively worsened as described by the patient. Right lower extremity drags and gets worse with fatigue and stress. Reports prior to therapy pt experienced multiple falls but has not fallen since surgery. Pt was educated in rationale for transfer to neuro specialty clinic to improve gait tolerance as well as reduce fall risk.    Rehab Potential Good   PT Frequency 2x / week   PT Duration 6 weeks   PT Treatment/Interventions ADLs/Self Care Home Management;Cryotherapy;Moist Heat;Gait training;Stair training;Functional mobility training;Therapeutic activities;Therapeutic exercise;Balance training;Patient/family education;Neuromuscular re-education;Manual techniques;Energy conservation;Passive range of motion   PT Next Visit Plan to be further assessed at neuro clinic for progression of care   Consulted and Agree with Plan of Care Patient       Patient will benefit from skilled therapeutic intervention in order to improve the following deficits and impairments:  Abnormal gait, Decreased activity tolerance, Difficulty walking, Decreased balance  Visit Diagnosis: Difficulty in walking, not elsewhere classified - Plan: PT plan of care cert/re-cert  History of falling - Plan: PT plan of care cert/re-cert  Other abnormalities of gait and mobility - Plan: PT  plan of care cert/re-cert  Muscle weakness (generalized) - Plan: PT plan of care cert/re-cert     Problem List There are no active problems to display for this patient.   Makahla Kiser C. Jagger Beahm PT, DPT 03/25/2016 5:41 PM   [facility] [address] [place], [place], [address] Phone: [phone]   Fax:  [phone]  Name: Velma Belshaw MRN: [id number] Date of Birth: 03/12/1981

## 2016-03-29 ENCOUNTER — Ambulatory Visit: Payer: BLUE CROSS/BLUE SHIELD | Admitting: Physical Therapy

## 2016-03-29 DIAGNOSIS — R2689 Other abnormalities of gait and mobility: Secondary | ICD-10-CM

## 2016-03-29 DIAGNOSIS — R262 Difficulty in walking, not elsewhere classified: Secondary | ICD-10-CM | POA: Diagnosis not present

## 2016-03-29 NOTE — Patient Instructions (Signed)
Bridging    Lying supine with knees bent, tighten stomach muscles. Raise hips off floor, tighten buttocks. Hold _5__ seconds. Repeat _10__ times.   Same as above but move knees in/out slowly x10  Same as above but alterante pulling knees to chests x10    Copyright  VHI. All rights reserved.

## 2016-03-29 NOTE — Therapy (Addendum)
[facility] [address] [place], [place], [address] Phone: [phone]   Fax:  [phone]  Physical Therapy Treatment/Discharge Summary  Patient Details  Name: Mitchell Ray MRN: [id number] Date of Birth: 1981/01/09 Referring Provider: Rachell Cipro, MD  Encounter Date: 03/29/2016      PT End of Session - 03/29/16 1541    Visit Number 2   Number of Visits 13   Date for PT Re-Evaluation 05/06/16   Authorization Type BCBS 30 visit limit   PT Start Time 5790   PT Stop Time 1534   PT Time Calculation (min) 38 min   Activity Tolerance Patient tolerated treatment well   Behavior During Therapy [facility] for tasks assessed/performed      Past Medical History  Diagnosis Date  . Bipolar 2 disorder ([place])   . ADHD (attention deficit hyperactivity disorder)   . Blind right eye     Past Surgical History  Procedure Laterality Date  . Eye surgery      as a child  . Incise and drain abcess  2010    There were no vitals filed for this visit.          [facility] PT Assessment - 03/29/16 0001    Standardized Balance Assessment   Standardized Balance Assessment Berg Balance Test   Berg Balance Test   Sit to Stand Able to stand without using hands and stabilize independently   Standing Unsupported Able to stand safely 2 minutes   Sitting with Back Unsupported but Feet Supported on Floor or Stool Able to sit safely and securely 2 minutes   Stand to Sit Sits safely with minimal use of hands   Transfers Able to transfer safely, minor use of hands   Standing Unsupported with Eyes Closed Able to stand 10 seconds safely   Standing Ubsupported with Feet Together Able to place feet together independently and stand 1 minute safely   From Standing, Reach Forward with Outstretched Arm Can reach confidently >25 cm (10")   From Standing Position, Pick up Object from Floor Able to pick up shoe safely and easily   From Standing  Position, Turn to Look Behind Over each Shoulder Looks behind from both sides and weight shifts well   Turn 360 Degrees Able to turn 360 degrees safely in 4 seconds or less   Standing Unsupported, Alternately Place Feet on Step/Stool Able to complete 4 steps without aid or supervision   Standing Unsupported, One Foot in Front Able to plae foot ahead of the other independently and hold 30 seconds   Standing on One Leg Able to lift leg independently and hold 5-10 seconds   Total Score 52                     [facility] Adult PT Treatment/Exercise - 03/29/16 0001    Ambulation/Gait   Ambulation/Gait Yes   Ambulation/Gait Assistance 6: Modified independent (Device/Increase time)   Ambulation Distance (Feet) 100 Feet   Assistive device Straight cane   Gait Pattern Right steppage;Decreased trunk rotation;Poor foot clearance - right   Ambulation Surface Level   Gait velocity 2.65f/sec with SPC and 2.716fsec without cane   Exercises   Exercises Knee/Hip;Lumbar  See HEP handout, performed as written with Min cues                PT Education - 03/29/16 1532    Education provided Yes   Education Details HEP for LE strengthening   Person(s) Educated  Patient   Methods Explanation;Demonstration;Verbal cues;Handout   Comprehension Verbalized understanding;Returned demonstration;Need further instruction;Verbal cues required             PT Long Term Goals - 03/25/16 1734    PT LONG TERM GOAL #1   Title pt will be able to ambulate while carrying an object using bilat upper extremities   Baseline pt reports he would fall over if he were to hold something in his hands while standing   Time 6   Period Weeks   Status New   PT LONG TERM GOAL #2   Title Pt will be able to climb steps safely without catching R foot on step.    Time 6   Period Weeks   Status New   PT LONG TERM GOAL #3   Title Pt will be able to safely ambulate through a crowd and navigate obstacles     Baseline pt reports he gets very nervous and gait worsens   Time 6   Period Weeks   Status New   PT LONG TERM GOAL #4   Title Pt will verbalize improved endurance in walking during daily activities, i.e. decreased dragging of right foot.    Time 6   Period Weeks   Status New               Plan - 03/29/16 1542    Clinical Impression Statement Performed Mitchell Ray 52/56 and gait velocity 2.61f/sec with SPC.  Noted coordination issues RLE during swing phase and balance deficits with standing with narrow BOS.  Pt demonstrates fatiguing quickly with gait.   Rehab Potential Good   PT Frequency 2x / week   PT Duration 6 weeks   PT Treatment/Interventions ADLs/Self Care Home Management;Cryotherapy;Moist Heat;Gait training;Stair training;Functional mobility training;Therapeutic activities;Therapeutic exercise;Balance training;Patient/family education;Neuromuscular re-education;Manual techniques;Energy conservation;Passive range of motion   PT Next Visit Plan to be further assessed at neuro clinic for progression of care: SOT? 670m walk? add/review HEP   Consulted and Agree with Plan of Care Patient      Patient will benefit from skilled therapeutic intervention in order to improve the following deficits and impairments:  Abnormal gait, Decreased activity tolerance, Difficulty walking, Decreased balance  Visit Diagnosis: Difficulty in walking, not elsewhere classified  Other abnormalities of gait and mobility     Problem List There are no active problems to display for this patient.  Mitchell LoserPTA  03/29/2016, 3:46 PM [address]one: [phone] Fax:  [phone]: Mitchell TedescoRN: [id number] of Birth: 1/May 13, 1981 PHYSICAL THERAPY DISCHARGE SUMMARY  Visits from Start of Care: 2  Current functional level related to goals / functional outcomes: See  above   Remaining deficits: See above   Education / Equipment: Anatomy of condition, POC, HEP, exercise form/rationale  Plan: Patient agrees to discharge.  Patient goals were not met. Patient is being discharged due to not returning since the last visit.  ?????    Mitchell Ray PT, DPT 09/29/16 3:14 PM

## 2016-04-15 ENCOUNTER — Ambulatory Visit: Payer: BLUE CROSS/BLUE SHIELD

## 2016-04-19 ENCOUNTER — Ambulatory Visit: Payer: BLUE CROSS/BLUE SHIELD | Admitting: Physical Therapy

## 2016-04-22 ENCOUNTER — Ambulatory Visit: Payer: BLUE CROSS/BLUE SHIELD | Admitting: Physical Therapy

## 2016-04-26 ENCOUNTER — Ambulatory Visit: Payer: BLUE CROSS/BLUE SHIELD | Admitting: Physical Therapy

## 2016-04-28 ENCOUNTER — Ambulatory Visit: Payer: BLUE CROSS/BLUE SHIELD | Admitting: Physical Therapy

## 2016-05-03 ENCOUNTER — Ambulatory Visit: Payer: BLUE CROSS/BLUE SHIELD | Admitting: Physical Therapy

## 2016-05-05 ENCOUNTER — Ambulatory Visit: Payer: BLUE CROSS/BLUE SHIELD | Admitting: Physical Therapy

## 2016-05-10 ENCOUNTER — Ambulatory Visit: Payer: BLUE CROSS/BLUE SHIELD | Admitting: Physical Therapy

## 2016-05-13 ENCOUNTER — Ambulatory Visit: Payer: BLUE CROSS/BLUE SHIELD

## 2016-05-17 ENCOUNTER — Ambulatory Visit: Payer: BLUE CROSS/BLUE SHIELD | Admitting: Physical Therapy

## 2016-05-19 ENCOUNTER — Ambulatory Visit: Payer: BLUE CROSS/BLUE SHIELD | Admitting: Physical Therapy

## 2016-05-25 ENCOUNTER — Ambulatory Visit: Payer: BLUE CROSS/BLUE SHIELD

## 2016-05-27 ENCOUNTER — Ambulatory Visit: Payer: BLUE CROSS/BLUE SHIELD | Admitting: Physical Therapy

## 2016-05-31 ENCOUNTER — Ambulatory Visit: Payer: BLUE CROSS/BLUE SHIELD | Admitting: Physical Therapy

## 2016-11-29 ENCOUNTER — Other Ambulatory Visit: Payer: Self-pay | Admitting: Neurosurgery

## 2016-11-29 DIAGNOSIS — M4802 Spinal stenosis, cervical region: Secondary | ICD-10-CM

## 2016-12-06 ENCOUNTER — Ambulatory Visit
Admission: RE | Admit: 2016-12-06 | Discharge: 2016-12-06 | Disposition: A | Discharge status: Home / Self Care | Payer: BLUE CROSS/BLUE SHIELD | Source: Ambulatory Visit | Attending: Neurosurgery | Admitting: Neurosurgery

## 2016-12-06 DIAGNOSIS — M4802 Spinal stenosis, cervical region: Secondary | ICD-10-CM

## 2017-03-30 ENCOUNTER — Ambulatory Visit (INDEPENDENT_AMBULATORY_CARE_PROVIDER_SITE_OTHER): Payer: BLUE CROSS/BLUE SHIELD | Admitting: Neurology

## 2017-03-30 ENCOUNTER — Encounter: Payer: Self-pay | Admitting: Neurology

## 2017-03-30 VITALS — BP 120/84 | HR 71 | Ht 72.0 in | Wt 285.4 lb

## 2017-03-30 DIAGNOSIS — G959 Disease of spinal cord, unspecified: Secondary | ICD-10-CM

## 2017-03-30 DIAGNOSIS — R261 Paralytic gait: Secondary | ICD-10-CM

## 2017-03-30 DIAGNOSIS — R202 Paresthesia of skin: Secondary | ICD-10-CM | POA: Diagnosis not present

## 2017-03-30 NOTE — Patient Instructions (Signed)
1.  Increase baclofen to 15mg  twice daily 2.  Continue physical therapy 3.  Send a Therapist, music in 3 months with an update

## 2017-03-30 NOTE — Progress Notes (Signed)
Follow-up Visit   Date: 03/30/17    Mitchell Ray MRN: [id number] DOB: [birth year]   Interim History: Mitchell Ray is a 36 y.o. right-handed Caucasian male with vitamin D deficiency, congenital amblyopia of the right eye, current tobacco user, bipolar disease, ADHD, and cervical myelopathy s/p C5-7 ACDF returning for follow-up of arm numbness and gait difficulty.  History of present illness: Initial visit 12/26/2015 for paresthesias, gait abnormality and falls:  In March 2016, he developed numbness/tingling over the last two fingers on the left and two weeks later, developed the same symptoms on the right hand.  Around the same time, he developed right leg limping.  Whenever he is stressed or nervous, he is unable to "control" his right leg, described as stiffness and states that the knees will not bend.  He reports having 5 falls in January.  He has been using a cane since September 2016 because of hip pain. In November, he started having cold sensation over the left upper thigh.  In December, he woke up with numbness over the thighs, inner thighs, and feet which has been constant.  He has an electrical impulse which travels down his body, which is triggered by laugher, lifting, and coughing which has been ongoing.  He has twitching and shaking of the hands especially in certain positions.  No history of neck trauma. No bowel/bladder incontinence.  In July 2016, he went to the emergency department for left upper arm pain.  He also complains of low back pain and right upper neck pain.  Because he did not get health insurance until this year, he did not have a PCP until 2017.    He came to see me in February 2017 and was noted to have myelopathic findings on exam and subsequent MRI cervical spine showed moderate spinal stenosis at C5-6 with focal T2 spinal cord hyperintensity at this level.  He was referred to neurosurgery and underwent decompression in April 2017.  UPDATE 03/30/2017:   Since his last visit, patient underwent C5-6 ACDF for spinal stenosis. Since his surgery, he did not have any new neurological symptoms, but he did not appreciate any benefit with numbness, gait imbalance, or leg stiffness.  He has been on baclofen 10mg  twice daily and cannot remember to take his noon dose.  He feels that it helps with stiffness because if he happens to skip the dose, his legs get much stiffer and walking is difficult.  He has many questions regarding his cervical myelopathy and prognosis for recovery and feels discouraged that surgery did not help make any of his symptoms better.    Medications:  Current Outpatient Prescriptions on File Prior to Visit  Medication Sig Dispense Refill  . baclofen (LIORESAL) 10 MG tablet Take half-tablet at bedtime for one week, then increase to 1 tablet at bedtime. (Patient taking differently: 3 (three) times daily. Take half-tablet at bedtime for one week, then increase to 1 tablet at bedtime.) 30 each 5  . cholecalciferol (VITAMIN D) 1000 units tablet Take 5,000 Units by mouth daily.    Marland Kitchen lamoTRIgine (LAMICTAL) 100 MG tablet Take 100 mg by mouth daily.     No current facility-administered medications on file prior to visit.     Allergies:  Allergies  Allergen Reactions  . Dilaudid [Hydromorphone Hcl] Itching    Review of Systems:  CONSTITUTIONAL: No fevers, chills, night sweats, or weight loss.  EYES: No visual changes or eye pain ENT: No hearing changes.  No history of nose bleeds.  RESPIRATORY: No cough, wheezing and shortness of breath.   CARDIOVASCULAR: Negative for chest pain, and palpitations.   GI: Negative for abdominal discomfort, blood in stools or black stools.  No recent change in bowel habits.   GU:  No history of incontinence.   MUSCLOSKELETAL: No history of joint pain or swelling.  No myalgias.   SKIN: Negative for lesions, rash, and itching.   ENDOCRINE: Negative for cold or heat intolerance, polydipsia or goiter.     PSYCH:  + depression +anxiety symptoms.   NEURO: As Above.   Vital Signs:  BP 120/84   Pulse 71   Ht 6' (1.829 m)   Wt 285 lb 7 oz (129.5 kg)   SpO2 98%   BMI 38.71 kg/m   Neurological Exam: MENTAL STATUS including orientation to time, place, person, recent and remote memory, attention span and concentration, language, and fund of knowledge is normal.  Speech is not dysarthric.  CRANIAL NERVES: No visual field defects. Pupils equal round and reactive to light.  Normal conjugate, extra-ocular eye movements in all directions of gaze, except severe right amblyopia. No ptosis. Normal facial sensation.  Face is symmetric. Palate elevates symmetrically.  Tongue is midline.  MOTOR:  Motor strength is 5/5 in all extremities.  Tone in the upper extremities is normal.  Tone in the lower extremities is 1+ on the right and 0+ on the left (stable).  No pronator drift.      MSRs:    Right                                           Left    Right brachioradialis 3+ 3+  biceps 3+ 3+  triceps 3+ 3+  patellar 4+ 4+  ankle jerk 3+ 3+  Sustained ankle clonus R >> L  SENSORY:  Vibration intact throughout; light touch is reduced in the upper and lower extremities, worse on the RLE. No sensory level.   COORDINATION/GAIT: Normal finger-to- nose-finger.  Finger tapping is intact (improved).  Markedly slowed toe tapping on the right. Severely spastic gait with circumduction and hyperextension of the right leg, assisted with cane   Data: MRI cervical spine 12/31/2015: 1. C5-6 disc degeneration including a left paracentral disc extrusion resulting in moderate spinal stenosis and mild cord compression. Focal T2 signal abnormality and enhancement in the cord at this level are felt to most likely reflect compressive myelopathy and not a separate inflammatory process or neoplasm. 2. Small C6-7 disc extrusion resulting in mild spinal stenosis.  MRI cervical spine 12/06/2016: No new abnormality since the prior  MRI.  Status post C5-7 ACDF since the prior examination. The central canal is widely patent at all levels. The central canal is widely patent at both levels. Moderately severe bilateral foraminal narrowing at C5-6 appears improved compared to the prior exam. Myelomalacia of the cord at C5-6 is again seen as on the prior study.  Shallow disc bulge at C4-5 without central canal stenosis. Uncovertebral disease causes mild bilateral foraminal narrowing at this level.   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  Discussed at length regarding the role of surgery and how surgery would prevent further neurological worsening, but may not reverse the neuronal injury already present.  With his myelomalacia, I anticipate that he will have permanent deficits causing spasticity and gait difficulty.  Clinically, he continues to  have severe right leg spasticity and numbness of the extremities. His most recent MRI from January 2018 showed decompressed spinal canal at C5-6 and continues to demonstrate myelomalacia at this level.  At this juncture, management is symptomatic. He would be better managed by PM&R to optimize his rehab potential and consider botox for right leg spasticity, but he would like to try out-patient PT.  I recommend that he increase baclofen to 15mg  twice daily, as he often forgets to take his noon dose.  He was offered a return visit in 3 months, but prefers to send a MyChart message with an update.  He had many questions which I answered to the best of my ability.    The duration of this appointment visit was 40 minutes of face-to-face time with the patient.  Greater than 50% of this time was spent in counseling, explanation of diagnosis, planning of further management, and coordination of care.   Thank you for allowing me to participate in patient's care.  If I can answer any additional questions, I would be pleased to do so.     Sincerely,    Donika K. Posey Pronto, DO

## 2017-04-05 ENCOUNTER — Institutional Professional Consult (permissible substitution): Payer: Self-pay | Admitting: Neurology

## 2017-07-22 IMAGING — MR MR CERVICAL SPINE WO/W CM
5 of 8 series · 28 of 48 positions shown · IV contrast (20ml Multihance)
Comparison: None.

CLINICAL DATA: Cervical myelopathy. Clonus and hyperreflexia.
Numbness and tingling in the hands and feet over the past year with
right leg limping.

EXAM:
MRI CERVICAL SPINE WITHOUT AND WITH CONTRAST
TECHNIQUE: Multiplanar and multiecho pulse sequences of the cervical spine, to
include the craniocervical junction and cervicothoracic junction,
were obtained according to standard protocol without and with
intravenous contrast.
CONTRAST:  20mL MULTIHANCE GADOBENATE DIMEGLUMINE 529 MG/ML IV SOLN

[Series 3: T2 · sagittal · 3.0mm · 0.66mm/px · 4 of 13 slices shown (1 of 2)]
[im 1/13]
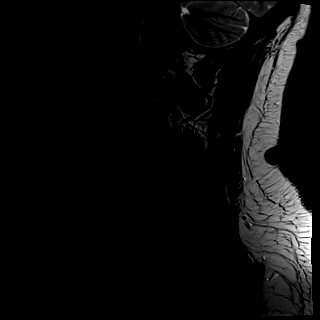
[im 5/13]
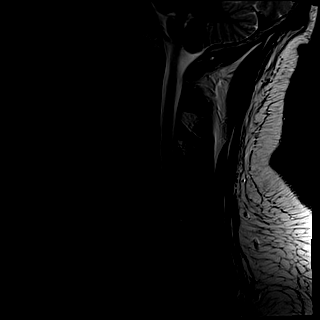
[im 9/13]
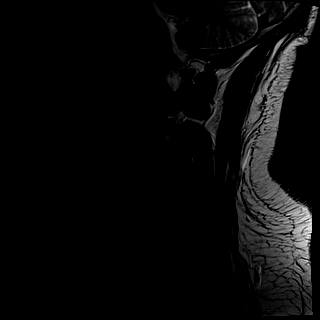
[im 13/13]
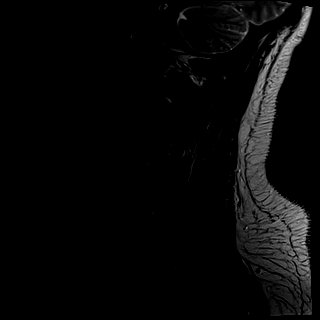

[Series 4: T1 · sagittal · 3.0mm · 0.41mm/px · 4 of 13 slices shown (1 of 2)]
[im 1/13]
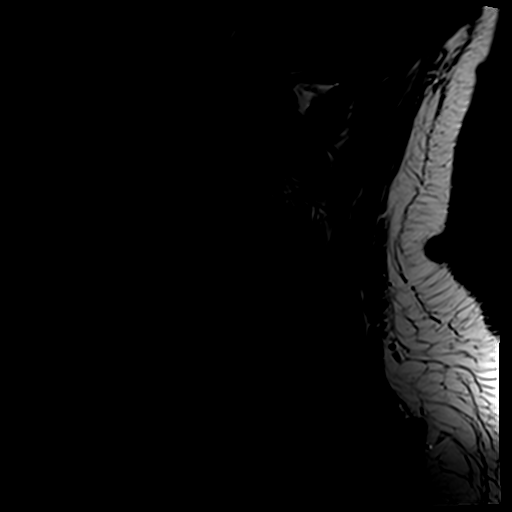
[im 5/13]
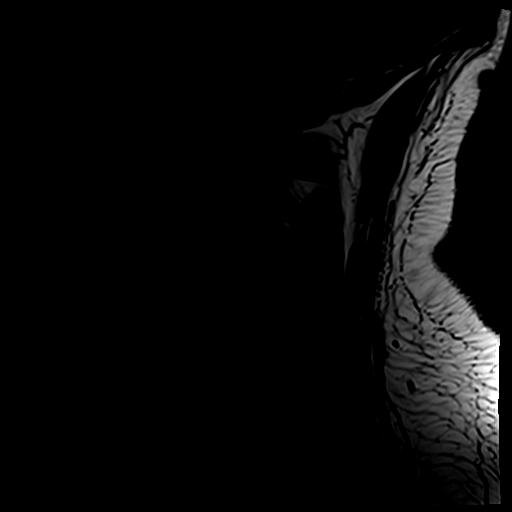
[im 9/13]
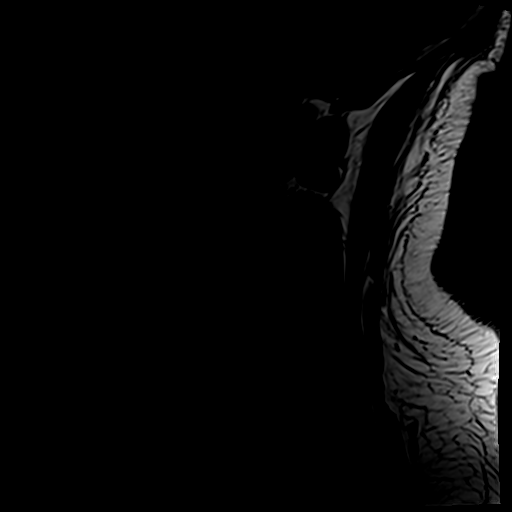
[im 13/13]
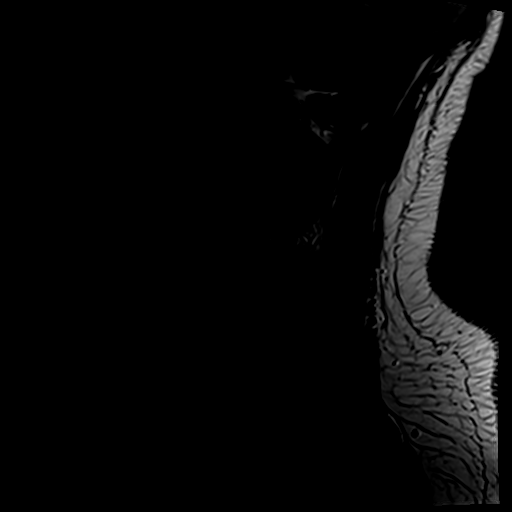

[Series 7: T2 · axial · 3.0mm · 0.78mm/px · z∈[-55,+43]mm · 8 of 28 slices shown (2 of 2)]
[im 1/28]
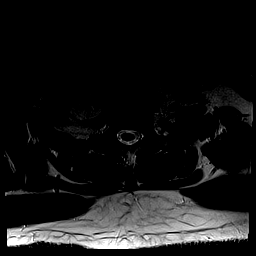
[im 4/28]
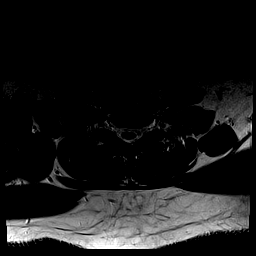
[im 8/28]
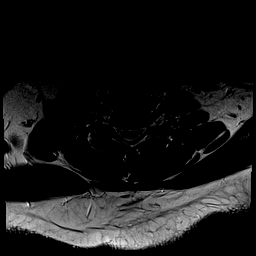
[im 12/28]
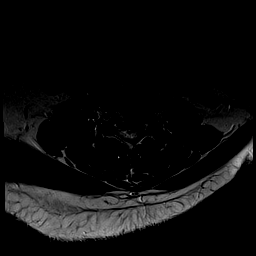
[im 16/28]
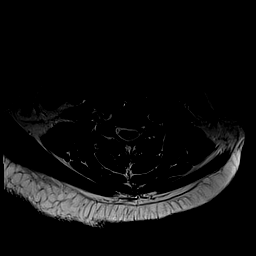
[im 20/28]
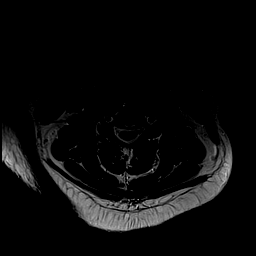
[im 24/28]
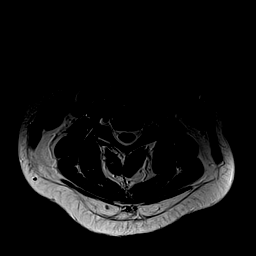
[im 28/28]
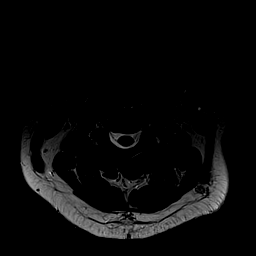

[Series 8: T1 · axial · 3.0mm · 0.39mm/px · z∈[-55,+43]mm · 8 of 28 slices shown (2 of 2)]
[im 1/28]
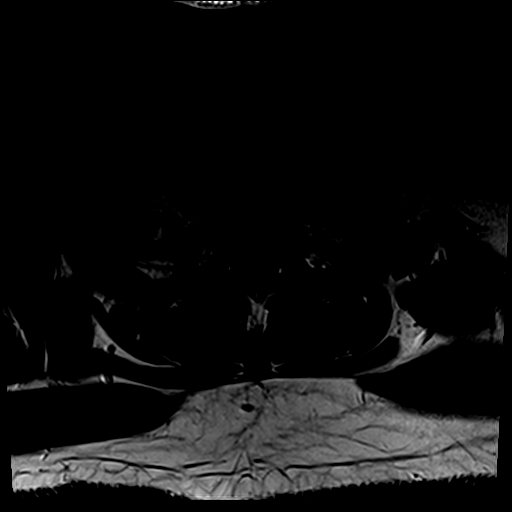
[im 4/28]
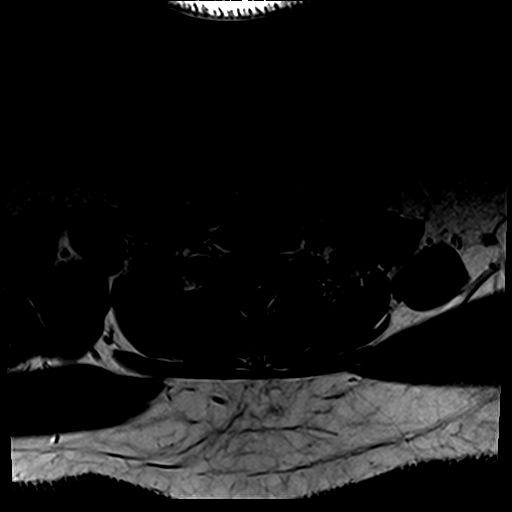
[im 8/28]
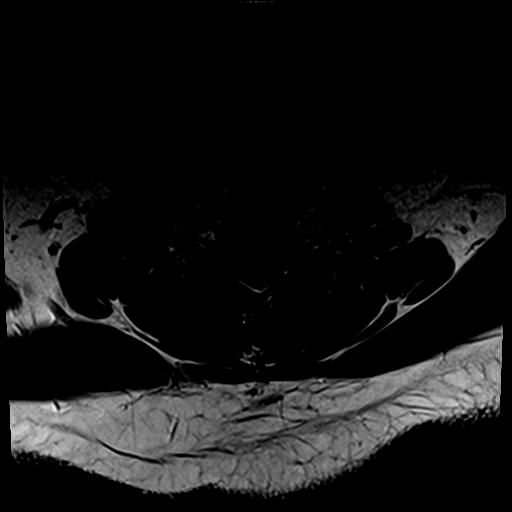
[im 12/28]
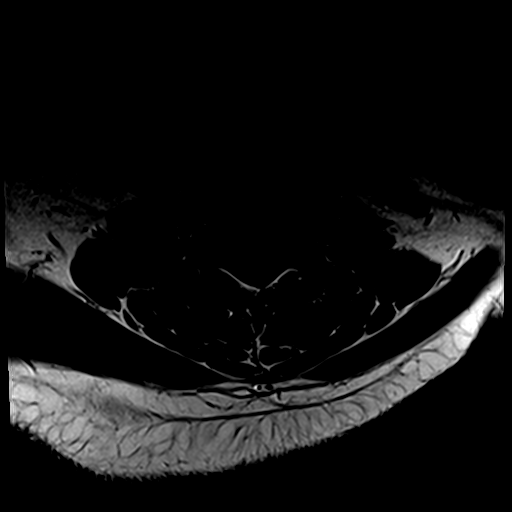
[im 16/28]
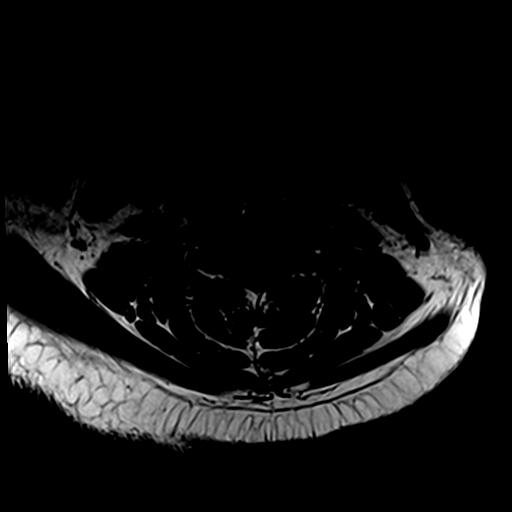
[im 20/28]
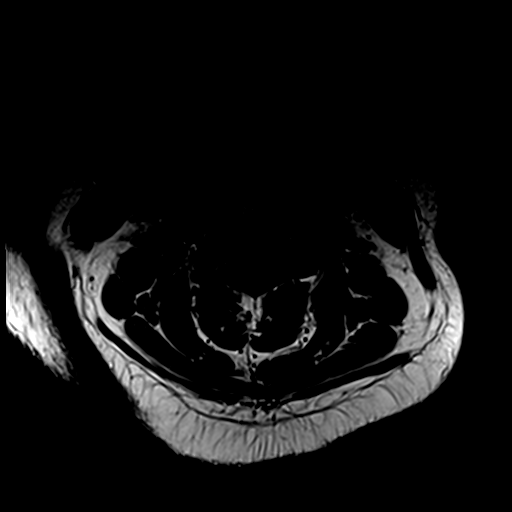
[im 24/28]
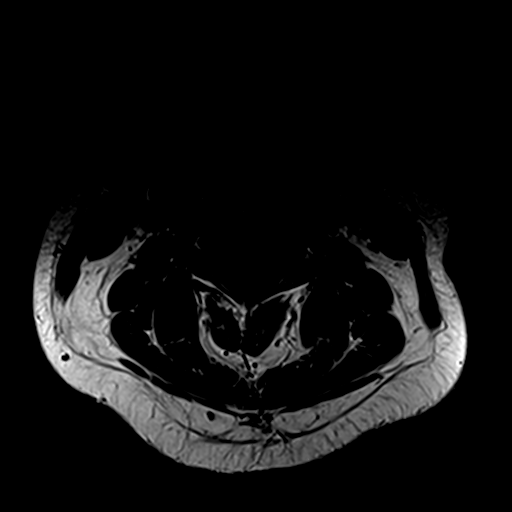
[im 28/28]
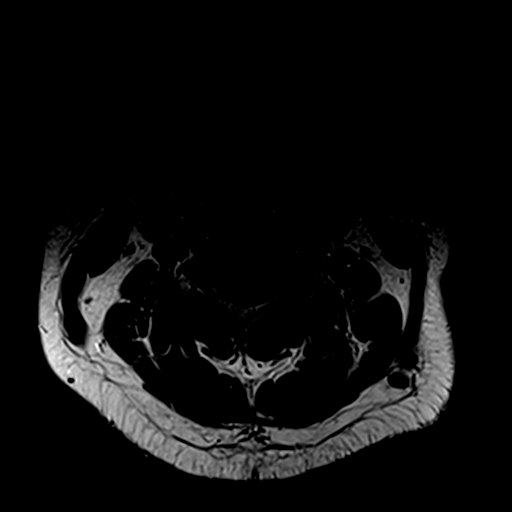

[Series 10: T1 fat-sat post-contrast · sagittal · 3.0mm · 0.66mm/px · 4 of 13 slices shown]
[im 1/13]
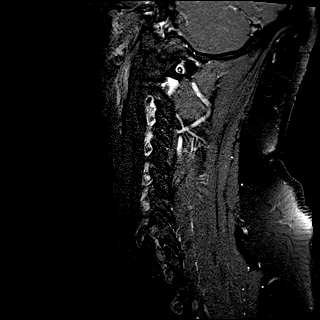
[im 5/13]
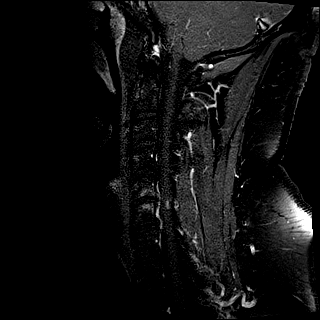
[im 9/13]
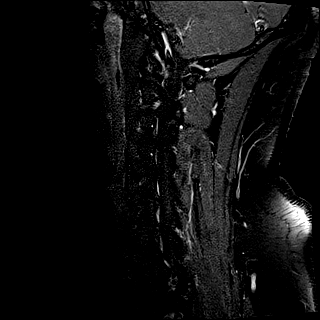
[im 13/13]
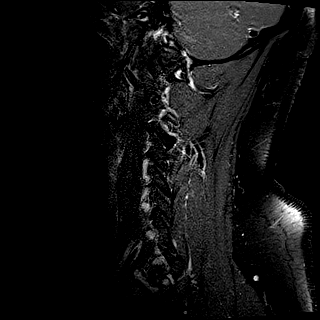

[28 of 48 positions shown; findings below may reference images not displayed]

FINDINGS: There is trace retrolisthesis of C5 on C6. Vertebral body heights
are preserved. Mild degenerative endplate edema and enhancement and
slight disc space narrowing are present at C5-6. There is focal T2
hyperintensity in the spinal cord at C5-6 measuring 7 mm in
craniocaudal length. Patchy enhancement is present in the dorsal
spinal cord at this level. The spinal cord is normal in appearance
elsewhere. Paraspinal soft tissues are unremarkable.

C2-3: Minimal right uncovertebral spurring without significant
stenosis.

C3-4:  Negative.

C4-5:  Minimal uncovertebral spurring without significant stenosis.

C5-6: Broad-based posterior disc osteophyte complex with focal left
paracentral disc extrusion results in moderate spinal stenosis with
mild cord compression. Right greater than left uncovertebral
spurring results in severe right and moderate left neural foraminal
stenosis.

C6-7: Broad right paracentral disc extrusion results in mild spinal
stenosis with slight cord flattening. Uncovertebral spurring results
in mild bilateral neural foraminal stenosis.

C7-T1:  Negative.
IMPRESSION: 1. C5-6 disc degeneration including a left paracentral disc
extrusion resulting in moderate spinal stenosis and mild cord
compression. Focal T2 signal abnormality and enhancement in the cord
at this level are felt to most likely reflect compressive myelopathy
and not a separate inflammatory process or neoplasm.
2. Small C6-7 disc extrusion resulting in mild spinal stenosis.
These results will be called to the ordering clinician or
representative by the Radiologist Assistant, and communication
documented in the PACS or zVision Dashboard.

## 2020-01-07 ENCOUNTER — Other Ambulatory Visit: Payer: Self-pay

## 2020-01-07 ENCOUNTER — Encounter: Payer: Self-pay | Admitting: Family Medicine

## 2020-01-07 ENCOUNTER — Ambulatory Visit (INDEPENDENT_AMBULATORY_CARE_PROVIDER_SITE_OTHER): Payer: 59 | Admitting: Family Medicine

## 2020-01-07 VITALS — BP 115/82 | HR 73 | Temp 97.5°F | Ht 72.0 in | Wt 316.4 lb

## 2020-01-07 DIAGNOSIS — Z125 Encounter for screening for malignant neoplasm of prostate: Secondary | ICD-10-CM | POA: Diagnosis not present

## 2020-01-07 DIAGNOSIS — E039 Hypothyroidism, unspecified: Secondary | ICD-10-CM

## 2020-01-07 DIAGNOSIS — R39198 Other difficulties with micturition: Secondary | ICD-10-CM

## 2020-01-07 DIAGNOSIS — Z0000 Encounter for general adult medical examination without abnormal findings: Secondary | ICD-10-CM | POA: Insufficient documentation

## 2020-01-07 DIAGNOSIS — F319 Bipolar disorder, unspecified: Secondary | ICD-10-CM

## 2020-01-07 DIAGNOSIS — Z720 Tobacco use: Secondary | ICD-10-CM | POA: Diagnosis not present

## 2020-01-07 DIAGNOSIS — Z23 Encounter for immunization: Secondary | ICD-10-CM

## 2020-01-07 LAB — LIPID PANEL
Cholesterol: 175 mg/dL (ref 0–200)
HDL: 26.4 mg/dL — ABNORMAL LOW (ref >39.00)
LDL Cholesterol: 117 mg/dL — ABNORMAL HIGH (ref 0–99)
NonHDL: 148.95
Total CHOL/HDL Ratio: 7
Triglycerides: 159 mg/dL — ABNORMAL HIGH (ref 0.0–149.0)
VLDL: 31.8 mg/dL (ref 0.0–40.0)

## 2020-01-07 LAB — URINALYSIS, ROUTINE W REFLEX MICROSCOPIC
Bilirubin Urine: NEGATIVE
Hgb urine dipstick: NEGATIVE
Ketones, ur: NEGATIVE
Leukocytes,Ua: NEGATIVE
Nitrite: NEGATIVE
RBC / HPF: NONE SEEN (ref 0–?)
Specific Gravity, Urine: 1.02 (ref 1.000–1.030)
Total Protein, Urine: NEGATIVE
Urine Glucose: NEGATIVE
Urobilinogen, UA: 0.2 (ref 0.0–1.0)
pH: 7 (ref 5.0–8.0)

## 2020-01-07 LAB — COMPREHENSIVE METABOLIC PANEL
ALT: 37 U/L (ref 0–53)
AST: 21 U/L (ref 0–37)
Albumin: 4.2 g/dL (ref 3.5–5.2)
Alkaline Phosphatase: 54 U/L (ref 39–117)
BUN: 9 mg/dL (ref 6–23)
CO2: 28 mEq/L (ref 19–32)
Calcium: 9.4 mg/dL (ref 8.4–10.5)
Chloride: 107 mEq/L (ref 96–112)
Creatinine, Ser: 1.02 mg/dL (ref 0.40–1.50)
GFR: 81.25 mL/min (ref >60.00)
Glucose, Bld: 97 mg/dL (ref 70–99)
Potassium: 3.9 mEq/L (ref 3.5–5.1)
Sodium: 142 mEq/L (ref 135–145)
Total Bilirubin: 0.7 mg/dL (ref 0.2–1.2)
Total Protein: 6.7 g/dL (ref 6.0–8.3)

## 2020-01-07 LAB — CBC
HCT: 45 % (ref 39.0–52.0)
Hemoglobin: 15.2 g/dL (ref 13.0–17.0)
MCHC: 33.7 g/dL (ref 30.0–36.0)
MCV: 94.2 fl (ref 78.0–100.0)
Platelets: 248 10*3/uL (ref 150.0–400.0)
RBC: 4.77 Mil/uL (ref 4.22–5.81)
RDW: 14.4 % (ref 11.5–15.5)
WBC: 8.4 10*3/uL (ref 4.0–10.5)

## 2020-01-07 LAB — TSH: TSH: 3.09 u[IU]/mL (ref 0.35–4.50)

## 2020-01-07 LAB — LDL CHOLESTEROL, DIRECT: Direct LDL: 130 mg/dL

## 2020-01-07 MED ORDER — NICOTINE 10 MG IN INHA: 1.0000 | RESPIRATORY_TRACT | 1 refills | Status: DC | PRN

## 2020-01-07 NOTE — Patient Instructions (Signed)
Preventive Care 19-39 Years Old, Male Preventive care refers to lifestyle choices and visits with your health care provider that can promote health and wellness. This includes:  A yearly physical exam. This is also called an annual well check.  Regular dental and eye exams.  Immunizations.  Screening for certain conditions.  Healthy lifestyle choices, such as eating a healthy diet, getting regular exercise, not using drugs or products that contain nicotine and tobacco, and limiting alcohol use. What can I expect for my preventive care visit? Physical exam Your health care provider will check:  Height and weight. These may be used to calculate body mass index (BMI), which is a measurement that tells if you are at a healthy weight.  Heart rate and blood pressure.  Your skin for abnormal spots. Counseling Your health care provider may ask you questions about:  Alcohol, tobacco, and drug use.  Emotional well-being.  Home and relationship well-being.  Sexual activity.  Eating habits.  Work and work Statistician. What immunizations do I need?  Influenza (flu) vaccine  This is recommended every year. Tetanus, diphtheria, and pertussis (Tdap) vaccine  You may need a Td booster every 10 years. Varicella (chickenpox) vaccine  You may need this vaccine if you have not already been vaccinated. Human papillomavirus (HPV) vaccine  If recommended by your health care provider, you may need three doses over 6 months. Measles, mumps, and rubella (MMR) vaccine  You may need at least one dose of MMR. You may also need a second dose. Meningococcal conjugate (MenACWY) vaccine  One dose is recommended if you are 45-76 years old and a Market researcher living in a residence hall, or if you have one of several medical conditions. You may also need additional booster doses. Pneumococcal conjugate (PCV13) vaccine  You may need this if you have certain conditions and were not  previously vaccinated. Pneumococcal polysaccharide (PPSV23) vaccine  You may need one or two doses if you smoke cigarettes or if you have certain conditions. Hepatitis A vaccine  You may need this if you have certain conditions or if you travel or work in places where you may be exposed to hepatitis A. Hepatitis B vaccine  You may need this if you have certain conditions or if you travel or work in places where you may be exposed to hepatitis B. Haemophilus influenzae type b (Hib) vaccine  You may need this if you have certain risk factors. You may receive vaccines as individual doses or as more than one vaccine together in one shot (combination vaccines). Talk with your health care provider about the risks and benefits of combination vaccines. What tests do I need? Blood tests  Lipid and cholesterol levels. These may be checked every 5 years starting at age 17.  Hepatitis C test.  Hepatitis B test. Screening   Diabetes screening. This is done by checking your blood sugar (glucose) after you have not eaten for a while (fasting).  Sexually transmitted disease (STD) testing. Talk with your health care provider about your test results, treatment options, and if necessary, the need for more tests. Follow these instructions at home: Eating and drinking   Eat a diet that includes fresh fruits and vegetables, whole grains, lean protein, and low-fat dairy products.  Take vitamin and mineral supplements as recommended by your health care provider.  Do not drink alcohol if your health care provider tells you not to drink.  If you drink alcohol: ? Limit how much you have to 0-2  drinks a day. ? Be aware of how much alcohol is in your drink. In the [place] one drink equals one 12 oz bottle of beer (355 mL), one 5 oz glass of wine (148 mL), or one 1 oz glass of hard liquor (44 mL). Lifestyle  Take daily care of your teeth and gums.  Stay active. Exercise for at least 30 minutes on 5 or  more days each week.  Do not use any products that contain nicotine or tobacco, such as cigarettes, e-cigarettes, and chewing tobacco. If you need help quitting, ask your health care provider.  If you are sexually active, practice safe sex. Use a condom or other form of protection to prevent STIs (sexually transmitted infections). What's next?  Go to your health care provider once a year for a well check visit.  Ask your health care provider how often you should have your eyes and teeth checked.  Stay up to date on all vaccines. This information is not intended to replace advice given to you by your health care provider. Make sure you discuss any questions you have with your health care provider. Document Revised: 10/19/2018 Document Reviewed: 10/19/2018 Elsevier Patient Education  2020 [employer]  Health Maintenance, Male Adopting a healthy lifestyle and getting preventive care are important in promoting health and wellness. Ask your health care provider about:  The right schedule for you to have regular tests and exams.  Things you can do on your own to prevent diseases and keep yourself healthy. What should I know about diet, weight, and exercise? Eat a healthy diet   Eat a diet that includes plenty of vegetables, fruits, low-fat dairy products, and lean protein.  Do not eat a lot of foods that are high in solid fats, added sugars, or sodium. Maintain a healthy weight Body mass index (BMI) is a measurement that can be used to identify possible weight problems. It estimates body fat based on height and weight. Your health care provider can help determine your BMI and help you achieve or maintain a healthy weight. Get regular exercise Get regular exercise. This is one of the most important things you can do for your health. Most adults should:  Exercise for at least 150 minutes each week. The exercise should increase your heart rate and make you sweat (moderate-intensity  exercise).  Do strengthening exercises at least twice a week. This is in addition to the moderate-intensity exercise.  Spend less time sitting. Even light physical activity can be beneficial. Watch cholesterol and blood lipids Have your blood tested for lipids and cholesterol at 39 years of age, then have this test every 5 years. You may need to have your cholesterol levels checked more often if:  Your lipid or cholesterol levels are high.  You are older than 40 years of age.  You are at high risk for heart disease. What should I know about cancer screening? Many types of cancers can be detected early and may often be prevented. Depending on your health history and family history, you may need to have cancer screening at various ages. This may include screening for:  Colorectal cancer.  Prostate cancer.  Skin cancer.  Lung cancer. What should I know about heart disease, diabetes, and high blood pressure? Blood pressure and heart disease  High blood pressure causes heart disease and increases the risk of stroke. This is more likely to develop in people who have high blood pressure readings, are of African descent, or are overweight.  Talk   with your health care provider about your target blood pressure readings.  Have your blood pressure checked: ? Every 3-5 years if you are 18-39 years of age. ? Every year if you are 40 years old or older.  If you are between the ages of 65 and 75 and are a current or former smoker, ask your health care provider if you should have a one-time screening for abdominal aortic aneurysm (AAA). Diabetes Have regular diabetes screenings. This checks your fasting blood sugar level. Have the screening done:  Once every three years after age 45 if you are at a normal weight and have a low risk for diabetes.  More often and at a younger age if you are overweight or have a high risk for diabetes. What should I know about preventing infection? Hepatitis  B If you have a higher risk for hepatitis B, you should be screened for this virus. Talk with your health care provider to find out if you are at risk for hepatitis B infection. Hepatitis C Blood testing is recommended for:  Everyone born from 1945 through 1965.  Anyone with known risk factors for hepatitis C. Sexually transmitted infections (STIs)  You should be screened each year for STIs, including gonorrhea and chlamydia, if: ? You are sexually active and are younger than 39 years of age. ? You are older than 39 years of age and your health care provider tells you that you are at risk for this type of infection. ? Your sexual activity has changed since you were last screened, and you are at increased risk for chlamydia or gonorrhea. Ask your health care provider if you are at risk.  Ask your health care provider about whether you are at high risk for HIV. Your health care provider may recommend a prescription medicine to help prevent HIV infection. If you choose to take medicine to prevent HIV, you should first get tested for HIV. You should then be tested every 3 months for as long as you are taking the medicine. Follow these instructions at home: Lifestyle  Do not use any products that contain nicotine or tobacco, such as cigarettes, e-cigarettes, and chewing tobacco. If you need help quitting, ask your health care provider.  Do not use street drugs.  Do not share needles.  Ask your health care provider for help if you need support or information about quitting drugs. Alcohol use  Do not drink alcohol if your health care provider tells you not to drink.  If you drink alcohol: ? Limit how much you have to 0-2 drinks a day. ? Be aware of how much alcohol is in your drink. In the [place] one drink equals one 12 oz bottle of beer (355 mL), one 5 oz glass of wine (148 mL), or one 1 oz glass of hard liquor (44 mL). General instructions  Schedule regular health, dental, and eye  exams.  Stay current with your vaccines.  Tell your health care provider if: ? You often feel depressed. ? You have ever been abused or do not feel safe at home. Summary  Adopting a healthy lifestyle and getting preventive care are important in promoting health and wellness.  Follow your health care provider's instructions about healthy diet, exercising, and getting tested or screened for diseases.  Follow your health care provider's instructions on monitoring your cholesterol and blood pressure. This information is not intended to replace advice given to you by your health care provider. Make sure you discuss any questions you   have with your health care provider. Document Revised: 10/18/2018 Document Reviewed: 10/18/2018 Elsevier Patient Education  2020 [place] with Quitting Smoking  Quitting smoking is a physical and mental challenge. You will face cravings, withdrawal symptoms, and temptation. Before quitting, work with your health care provider to make a plan that can help you cope. Preparation can help you quit and keep you from giving in. How can I cope with cravings? Cravings usually last for 5-10 minutes. If you get through it, the craving will pass. Consider taking the following actions to help you cope with cravings:  Keep your mouth busy: ? Chew sugar-free gum. ? Suck on hard candies or a straw. ? Brush your teeth.  Keep your hands and body busy: ? Immediately change to a different activity when you feel a craving. ? Squeeze or play with a ball. ? Do an activity or a hobby, like making bead jewelry, practicing needlepoint, or working with wood. ? Mix up your normal routine. ? Take a short exercise break. Go for a quick walk or run up and down stairs. ? Spend time in public places where smoking is not allowed.  Focus on doing something kind or helpful for someone else.  Call a friend or family member to talk during a craving.  Join a support  group.  Call a quit line, such as 1-800-QUIT-NOW.  Talk with your health care provider about medicines that might help you cope with cravings and make quitting easier for you. How can I deal with withdrawal symptoms? Your body may experience negative effects as it tries to get used to not having nicotine in the system. These effects are called withdrawal symptoms. They may include:  Feeling hungrier than normal.  Trouble concentrating.  Irritability.  Trouble sleeping.  Feeling depressed.  Restlessness and agitation.  Craving a cigarette. To manage withdrawal symptoms:  Avoid places, people, and activities that trigger your cravings.  Remember why you want to quit.  Get plenty of sleep.  Avoid coffee and other caffeinated drinks. These may worsen some of your symptoms. How can I handle social situations? Social situations can be difficult when you are quitting smoking, especially in the first few weeks. To manage this, you can:  Avoid parties, bars, and other social situations where people might be smoking.  Avoid alcohol.  Leave right away if you have the urge to smoke.  Explain to your family and friends that you are quitting smoking. Ask for understanding and support.  Plan activities with friends or family where smoking is not an option. What are some ways I can cope with stress? Wanting to smoke may cause stress, and stress can make you want to smoke. Find ways to manage your stress. Relaxation techniques can help. For example:  Breathe slowly and deeply, in through your nose and out through your mouth.  Listen to soothing, relaxing music.  Talk with a family member or friend about your stress.  Light a candle.  Soak in a bath or take a shower.  Think about a peaceful place. What are some ways I can prevent weight gain? Be aware that many people gain weight after they quit smoking. However, not everyone does. To keep from gaining weight, have a plan in  place before you quit and stick to the plan after you quit. Your plan should include:  Having healthy snacks. When you have a craving, it may help to: ? Eat plain popcorn, crunchy carrots, celery, or other cut vegetables. ?  Chew sugar-free gum.  Changing how you eat: ? Eat small portion sizes at meals. ? Eat 4-6 small meals throughout the day instead of 1-2 large meals a day. ? Be mindful when you eat. Do not watch television or do other things that might distract you as you eat.  Exercising regularly: ? Make time to exercise each day. If you do not have time for a long workout, do short bouts of exercise for 5-10 minutes several times a day. ? Do some form of strengthening exercise, like weight lifting, and some form of aerobic exercise, like running or swimming.  Drinking plenty of water or other low-calorie or no-calorie drinks. Drink 6-8 glasses of water daily, or as much as instructed by your health care provider. Summary  Quitting smoking is a physical and mental challenge. You will face cravings, withdrawal symptoms, and temptation to smoke again. Preparation can help you as you go through these challenges.  You can cope with cravings by keeping your mouth busy (such as by chewing gum), keeping your body and hands busy, and making calls to family, friends, or a helpline for people who want to quit smoking.  You can cope with withdrawal symptoms by avoiding places where people smoke, avoiding drinks with caffeine, and getting plenty of rest.  Ask your health care provider about the different ways to prevent weight gain, avoid stress, and handle social situations. This information is not intended to replace advice given to you by your health care provider. Make sure you discuss any questions you have with your health care provider. Document Revised: 10/07/2017 Document Reviewed: 10/22/2016 Elsevier Patient Education  2020 [employer].

## 2020-01-07 NOTE — Progress Notes (Signed)
New Patient Office Visit  Subjective:  Patient ID: Mitchell Ray, male    DOB: 1981-01-27  Age: 39 y.o. MRN: [id number]  CC:  Chief Complaint  Patient presents with  . Establish Care    New patient, pt would like to take about quitting smoking, go over reflux issues, would like a referral to Urologist.     HPI Mitchell Ray presents for establishment of care.  He is particularly interested in quitting smoking.  Tells for him smoking is all about having something in his hands when he needs to take a break or relax or after good meal.  Tells of spray with his urine stream on occasion.  Flow is good.  Would like to see her urologist.  Currently seeing psychiatry for bipolar disease and hypothyroidism because his psychiatrist diagnosed it.  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  Lives alone.  Also would like to buy a house in stopping smoking would help save some money in order to do so.  He has a significant other close by.  His parents live in [place].  Past Medical History:  Diagnosis Date  . ADHD (attention deficit hyperactivity disorder)   . Bipolar 2 disorder ([place])   . Blind right eye     Past Surgical History:  Procedure Laterality Date  . EYE SURGERY     as a child  . INCISE AND DRAIN ABCESS  2010  . SPINE SURGERY      Family History  Problem Relation Age of Onset  . Cancer Mother   . Diabetes Mellitus II Mother   . Hyperlipidemia Mother   . Hypertension Mother   . Healthy Father   . Healthy Brother     Social History   Socioeconomic History  . Marital status: Significant Other    Spouse name: Not on file  . Number of children: Not on file  . Years of education: Not on file  . Highest education level: Not on file  Occupational History  . Not on file  Tobacco Use  . Smoking status: Current Every Day Smoker    Packs/day: 1.00    Years: 10.00    Pack years: 10.00    Types: Cigarettes  .  Smokeless tobacco: Never Used  . Tobacco comment: Trying to quit, down to 5 cigarettes daily  Substance and Sexual Activity  . Alcohol use: Yes    Alcohol/week: 0.0 standard drinks    Comment: rarely  . Drug use: No  . Sexual activity: Not on file  Other Topics Concern  . Not on file  Social History Narrative   He works part-time at [employer]   He is a Charity fundraiser at [employer]   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    Social Determinants of Health   Financial Resource Strain:   . Difficulty of Paying Living Expenses: Not on file  Food Insecurity:   . Worried About Charity fundraiser in the Last Year: Not on file  . Ran Out of Food in the Last Year: Not on file  Transportation Needs:   . Lack of Transportation (Medical): Not on file  . Lack of Transportation (Non-Medical): Not on file  Physical Activity:   . Days of Exercise per Week: Not on file  . Minutes of Exercise per Session: Not on file  Stress:   . Feeling of Stress : Not on file  Social Connections:   .  Frequency of Communication with Friends and Family: Not on file  . Frequency of Social Gatherings with Friends and Family: Not on file  . Attends Religious Services: Not on file  . Active Member of Clubs or Organizations: Not on file  . Attends Archivist Meetings: Not on file  . Marital Status: Not on file  Intimate Partner Violence:   . Fear of Current or Ex-Partner: Not on file  . Emotionally Abused: Not on file  . Physically Abused: Not on file  . Sexually Abused: Not on file    ROS Review of Systems  Constitutional: Negative.   HENT: Negative.   Eyes: Positive for visual disturbance.  Respiratory: Negative.   Cardiovascular: Negative.   Gastrointestinal: Negative.   Endocrine: Negative for polyphagia and polyuria.  Genitourinary: Negative for difficulty urinating, frequency and urgency.  Musculoskeletal: Positive for gait problem.  Skin: Negative for pallor and rash.   Allergic/Immunologic: Negative for immunocompromised state.  Neurological: Negative for weakness, light-headedness and numbness.  Hematological: Does not bruise/bleed easily.   Depression screen PHQ 2/9 01/07/2020  Decreased Interest 3  Down, Depressed, Hopeless 1  PHQ - 2 Score 4  Altered sleeping 3  Tired, decreased energy 3  Change in appetite 3  Feeling bad or failure about yourself  2  Trouble concentrating 3  Moving slowly or fidgety/restless 0  Suicidal thoughts 0  PHQ-9 Score 18  Difficult doing work/chores Somewhat difficult    Objective:   Today's Vitals: BP 115/82   Pulse 73   Temp (!) 97.5 F (36.4 C) (Tympanic)   Ht 6' (1.829 m)   Wt (!) 316 lb 6.4 oz (143.5 kg)   SpO2 96%   BMI 42.91 kg/m   Physical Exam Vitals and nursing note reviewed.  Constitutional:      General: He is not in acute distress.    Appearance: Normal appearance. He is obese. He is not ill-appearing, toxic-appearing or diaphoretic.  HENT:     Head: Normocephalic and atraumatic.     Right Ear: Tympanic membrane and ear canal normal.     Left Ear: Tympanic membrane and ear canal normal.  Eyes:     General: No scleral icterus.       Right eye: No discharge.        Left eye: No discharge.  Cardiovascular:     Rate and Rhythm: Normal rate and regular rhythm.  Pulmonary:     Effort: Pulmonary effort is normal.     Breath sounds: Normal breath sounds.  Abdominal:     General: Bowel sounds are normal. There is no distension.     Tenderness: There is no abdominal tenderness. There is no guarding or rebound.     Hernia: No hernia is present. There is no hernia in the left inguinal area or right inguinal area.  Genitourinary:    Penis: Uncircumcised. No phimosis, erythema, tenderness, discharge, swelling or lesions.      Testes:        Right: Mass, tenderness, swelling, testicular hydrocele or varicocele not present. Right testis is descended.        Left: Mass, tenderness, swelling,  testicular hydrocele or varicocele not present. Left testis is descended.     Epididymis:     Right: Not inflamed or enlarged.     Left: Not inflamed or enlarged.  Musculoskeletal:     Cervical back: No rigidity or tenderness.  Lymphadenopathy:     Cervical: No cervical adenopathy.     Lower  Body: No right inguinal adenopathy. No left inguinal adenopathy.  Skin:    General: Skin is warm and dry.  Neurological:     Mental Status: He is alert and oriented to person, place, and time.  Psychiatric:        Mood and Affect: Mood normal.        Behavior: Behavior normal.     Assessment & Plan:   Problem List Items Addressed This Visit      Endocrine   Acquired hypothyroidism   Relevant Medications   levothyroxine (SYNTHROID) 100 MCG tablet   Other Relevant Orders   TSH     Other   Urine stream spraying   Relevant Orders   Urinalysis, Routine w reflex microscopic   Ambulatory referral to Urology   Tobacco use   Relevant Medications   nicotine (NICOTROL) 10 MG inhaler   Healthcare maintenance - Primary   Relevant Orders   CBC   Comprehensive metabolic panel   LDL cholesterol, direct   Lipid panel   HIV Antibody (routine testing w rflx)   Bipolar disease, chronic (HCC)   Need for influenza vaccination   Relevant Orders   Flu Vaccine QUAD 6+ mos PF IM (Fluarix Quad PF) (Completed)      Outpatient Encounter Medications as of 01/07/2020  Medication Sig  . lamoTRIgine (LAMICTAL) 100 MG tablet Take 100 mg by mouth daily.  Marland Kitchen levothyroxine (SYNTHROID) 100 MCG tablet Take 100 mcg by mouth daily before breakfast.  . lithium carbonate (ESKALITH) 450 MG CR tablet Take 450 mg by mouth daily.  . nicotine (NICOTROL) 10 MG inhaler Inhale 1 Cartridge (1 continuous puffing total) into the lungs as needed for smoking cessation.  . [DISCONTINUED] amphetamine-dextroamphetamine (ADDERALL) 30 MG tablet   . [DISCONTINUED] Armodafinil 200 MG TABS   . [DISCONTINUED] baclofen (LIORESAL) 10 MG  tablet Take half-tablet at bedtime for one week, then increase to 1 tablet at bedtime. (Patient taking differently: 3 (three) times daily. Take half-tablet at bedtime for one week, then increase to 1 tablet at bedtime.)  . [DISCONTINUED] cholecalciferol (VITAMIN D) 1000 units tablet Take 5,000 Units by mouth daily.  . [DISCONTINUED] clindamycin-benzoyl peroxide (BENZACLIN) gel APP EXT AA BID   No facility-administered encounter medications on file as of 01/07/2020.    Follow-up: Return in about 3 months (around 04/08/2020).   Patient was given information on health maintenance and disease prevention as well as steps to quit smoking.  Libby Maw, MD

## 2020-01-08 LAB — HIV ANTIBODY (ROUTINE TESTING W REFLEX): HIV 1&2 Ab, 4th Generation: NONREACTIVE

## 2020-01-17 ENCOUNTER — Other Ambulatory Visit: Payer: Self-pay | Admitting: Urology

## 2020-01-22 NOTE — Patient Instructions (Addendum)
DUE TO COVID-19 ONLY ONE VISITOR IS ALLOWED TO COME WITH YOU AND STAY IN THE WAITING ROOM ONLY DURING PRE OP AND PROCEDURE DAY OF SURGERY. THE 1 VISITOR MAY VISIT WITH YOU AFTER SURGERY IN YOUR PRIVATE ROOM DURING VISITING HOURS ONLY!  YOU NEED TO HAVE A COVID 19 TEST ON 01-26-20 @ 10:10 AM THIS TEST MUST BE DONE BEFORE SURGERY, COME  [address], [address] , [id number].  ([place]) ONCE YOUR COVID TEST IS COMPLETED, PLEASE BEGIN THE QUARANTINE INSTRUCTIONS AS OUTLINED IN YOUR HANDOUT.                Mitchell Ray  01/22/2020   Your procedure is scheduled on: 01-30-20   Report to [facility] Main  Entrance    Report to Admitting at 7:30 AM     Call this number if you have problems the morning of surgery [phone]    Remember: Do not eat food or drink liquids :After Midnight.     Take these medicines the morning of surgery with A SIP OF WATER: Lamotrigine (Lamictal), Levothyroxine (Synthroid), and Lithium Carbonate (Eskalith)   BRUSH YOUR TEETH MORNING OF SURGERY AND RINSE YOUR MOUTH OUT, NO CHEWING GUM CANDY OR MINTS.                               You may not have any metal on your body including hair pins and              piercings    Do not wear jewelry, cologne, lotions, powders or deodorant                       Men may shave face and neck.   Do not bring valuables to the hospital. [place].  Contacts, dentures or bridgework may not be worn into surgery.      Patients discharged the day of surgery will not be allowed to drive home. IF YOU ARE HAVING SURGERY AND GOING HOME THE SAME DAY, YOU MUST HAVE AN ADULT TO DRIVE YOU HOME AND BE WITH YOU FOR 24 HOURS. YOU MAY GO HOME BY TAXI OR UBER OR ORTHERWISE, BUT AN ADULT MUST ACCOMPANY YOU HOME AND STAY WITH YOU FOR 24 HOURS.  Name and phone number of your driver: Faythe Dingwall [phone]  Special Instructions: N/A              Please read over the  following fact sheets you were given: _____________________________________________________________________             [facility] - Preparing for Surgery Before surgery, you can play an important role.  Because skin is not sterile, your skin needs to be as free of germs as possible.  You can reduce the number of germs on your skin by washing with CHG (chlorahexidine gluconate) soap before surgery.  CHG is an antiseptic cleaner which kills germs and bonds with the skin to continue killing germs even after washing. Please DO NOT use if you have an allergy to CHG or antibacterial soaps.  If your skin becomes reddened/irritated stop using the CHG and inform your nurse when you arrive at Short Stay. Do not shave (including legs and underarms) for at least 48 hours prior to the first CHG shower.  You may shave  your face/neck. Please follow these instructions carefully:  1.  Shower with CHG Soap the night before surgery and the  morning of Surgery.  2.  If you choose to wash your hair, wash your hair first as usual with your  normal  shampoo.  3.  After you shampoo, rinse your hair and body thoroughly to remove the  shampoo.                           4.  Use CHG as you would any other liquid soap.  You can apply chg directly  to the skin and wash                       Gently with a scrungie or clean washcloth.  5.  Apply the CHG Soap to your body ONLY FROM THE NECK DOWN.   Do not use on face/ open                           Wound or open sores. Avoid contact with eyes, ears mouth and genitals (private parts).                       Wash face,  Genitals (private parts) with your normal soap.             6.  Wash thoroughly, paying special attention to the area where your surgery  will be performed.  7.  Thoroughly rinse your body with warm water from the neck down.  8.  DO NOT shower/wash with your normal soap after using and rinsing off  the CHG Soap.                9.  Pat yourself dry with a clean  towel.            10.  Wear clean pajamas.            11.  Place clean sheets on your bed the night of your first shower and do not  sleep with pets. Day of Surgery : Do not apply any lotions/deodorants the morning of surgery.  Please wear clean clothes to the hospital/surgery center.  FAILURE TO FOLLOW THESE INSTRUCTIONS MAY RESULT IN THE CANCELLATION OF YOUR SURGERY PATIENT SIGNATURE_________________________________  NURSE SIGNATURE__________________________________  ________________________________________________________________________

## 2020-01-22 NOTE — Progress Notes (Signed)
PCP - Libby Maw, MD Cardiologist -   Chest x-ray -  EKG -  Stress Test -  ECHO -  Cardiac Cath -   Sleep Study -  CPAP -   Fasting Blood Sugar -  Checks Blood Sugar _____ times a day  Blood Thinner Instructions: Aspirin Instructions: Last Dose:  Anesthesia review:   Patient denies shortness of breath, fever, cough and chest pain at PAT appointment   Patient verbalized understanding of instructions that were given to them at the PAT appointment. Patient was also instructed that they will need to review over the PAT instructions again at home before surgery.

## 2020-01-23 ENCOUNTER — Other Ambulatory Visit: Payer: Self-pay

## 2020-01-23 ENCOUNTER — Encounter (HOSPITAL_COMMUNITY)
Admission: RE | Admit: 2020-01-23 | Discharge: 2020-01-23 | Disposition: A | Discharge status: Home / Self Care | Payer: 59 | Source: Ambulatory Visit | Attending: Urology | Admitting: Urology

## 2020-01-23 ENCOUNTER — Encounter (HOSPITAL_COMMUNITY): Payer: Self-pay

## 2020-01-23 HISTORY — DX: Sleep apnea, unspecified: G47.30

## 2020-01-25 ENCOUNTER — Other Ambulatory Visit: Payer: Self-pay

## 2020-01-25 ENCOUNTER — Encounter (HOSPITAL_COMMUNITY)
Admission: RE | Admit: 2020-01-25 | Discharge: 2020-01-25 | Disposition: A | Discharge status: Home / Self Care | Payer: 59 | Source: Ambulatory Visit | Attending: Urology | Admitting: Urology

## 2020-01-25 DIAGNOSIS — Z01812 Encounter for preprocedural laboratory examination: Secondary | ICD-10-CM | POA: Insufficient documentation

## 2020-01-25 LAB — CBC
HCT: 44.3 % (ref 39.0–52.0)
Hemoglobin: 14.3 g/dL (ref 13.0–17.0)
MCH: 30.6 pg (ref 26.0–34.0)
MCHC: 32.3 g/dL (ref 30.0–36.0)
MCV: 94.9 fL (ref 80.0–100.0)
Platelets: 292 10*3/uL (ref 150–400)
RBC: 4.67 MIL/uL (ref 4.22–5.81)
RDW: 13.8 % (ref 11.5–15.5)
WBC: 8.9 10*3/uL (ref 4.0–10.5)
nRBC: 0 % (ref 0.0–0.2)

## 2020-01-26 ENCOUNTER — Other Ambulatory Visit (HOSPITAL_COMMUNITY)
Admission: RE | Admit: 2020-01-26 | Discharge: 2020-01-26 | Disposition: A | Discharge status: Home / Self Care | Payer: 59 | Source: Ambulatory Visit | Attending: Urology | Admitting: Urology

## 2020-01-26 DIAGNOSIS — Z20822 Contact with and (suspected) exposure to covid-19: Secondary | ICD-10-CM | POA: Insufficient documentation

## 2020-01-26 DIAGNOSIS — Z01812 Encounter for preprocedural laboratory examination: Secondary | ICD-10-CM | POA: Insufficient documentation

## 2020-01-26 LAB — SARS CORONAVIRUS 2 (TAT 6-24 HRS): SARS Coronavirus 2: NEGATIVE

## 2020-01-29 MED ORDER — DEXTROSE 5 % IV SOLN
3.0000 g | INTRAVENOUS | Status: AC
  Administered 2020-01-30: 3 g via INTRAVENOUS
  Filled 2020-01-29: qty 3

## 2020-01-30 ENCOUNTER — Encounter (HOSPITAL_COMMUNITY): Payer: Self-pay | Admitting: Urology

## 2020-01-30 ENCOUNTER — Other Ambulatory Visit: Payer: Self-pay

## 2020-01-30 ENCOUNTER — Ambulatory Visit (HOSPITAL_COMMUNITY): Payer: 59 | Admitting: Certified Registered Nurse Anesthetist

## 2020-01-30 ENCOUNTER — Encounter (HOSPITAL_COMMUNITY)
Admission: RE | Disposition: A | Discharge status: Home / Self Care | Payer: Self-pay | Source: Home / Self Care | Attending: Urology

## 2020-01-30 ENCOUNTER — Ambulatory Visit (HOSPITAL_COMMUNITY)
Admission: RE | Admit: 2020-01-30 | Discharge: 2020-01-30 | Disposition: A | Discharge status: Home / Self Care | Payer: 59 | Attending: Urology | Admitting: Urology

## 2020-01-30 DIAGNOSIS — Z79899 Other long term (current) drug therapy: Secondary | ICD-10-CM | POA: Insufficient documentation

## 2020-01-30 DIAGNOSIS — G4730 Sleep apnea, unspecified: Secondary | ICD-10-CM | POA: Diagnosis not present

## 2020-01-30 DIAGNOSIS — E039 Hypothyroidism, unspecified: Secondary | ICD-10-CM | POA: Insufficient documentation

## 2020-01-30 DIAGNOSIS — N478 Other disorders of prepuce: Secondary | ICD-10-CM | POA: Diagnosis present

## 2020-01-30 DIAGNOSIS — R3913 Splitting of urinary stream: Secondary | ICD-10-CM | POA: Insufficient documentation

## 2020-01-30 DIAGNOSIS — F17210 Nicotine dependence, cigarettes, uncomplicated: Secondary | ICD-10-CM | POA: Insufficient documentation

## 2020-01-30 DIAGNOSIS — Z885 Allergy status to narcotic agent status: Secondary | ICD-10-CM | POA: Diagnosis not present

## 2020-01-30 DIAGNOSIS — N481 Balanitis: Secondary | ICD-10-CM | POA: Insufficient documentation

## 2020-01-30 DIAGNOSIS — Z79890 Hormone replacement therapy (postmenopausal): Secondary | ICD-10-CM | POA: Insufficient documentation

## 2020-01-30 DIAGNOSIS — F319 Bipolar disorder, unspecified: Secondary | ICD-10-CM | POA: Insufficient documentation

## 2020-01-30 HISTORY — PX: CIRCUMCISION REVISION: SHX6634

## 2020-01-30 SURGERY — REVISION, CIRCUMCISION
Anesthesia: General

## 2020-01-30 MED ORDER — FENTANYL CITRATE (PF) 100 MCG/2ML IJ SOLN: 25.0000 ug | INTRAMUSCULAR | Status: DC | PRN

## 2020-01-30 MED ORDER — IBUPROFEN 100 MG/5ML PO SUSP
200.0000 mg | Freq: Four times a day (QID) | ORAL | Status: DC | PRN
  Filled 2020-01-30: qty 20

## 2020-01-30 MED ORDER — BUPIVACAINE HCL 0.25 % IJ SOLN
INTRAMUSCULAR | Status: AC
  Filled 2020-01-30: qty 1

## 2020-01-30 MED ORDER — BUPIVACAINE HCL (PF) 0.25 % IJ SOLN
INTRAMUSCULAR | Status: DC | PRN
  Administered 2020-01-30: 10 mL

## 2020-01-30 MED ORDER — KETOROLAC TROMETHAMINE 30 MG/ML IJ SOLN: 30.0000 mg | Freq: Once | INTRAMUSCULAR | Status: DC | PRN

## 2020-01-30 MED ORDER — CEFAZOLIN SODIUM-DEXTROSE 2-4 GM/100ML-% IV SOLN
INTRAVENOUS | Status: AC
  Filled 2020-01-30: qty 200

## 2020-01-30 MED ORDER — EPHEDRINE SULFATE-NACL 50-0.9 MG/10ML-% IV SOSY
PREFILLED_SYRINGE | INTRAVENOUS | Status: DC | PRN
  Administered 2020-01-30: 10 mg via INTRAVENOUS

## 2020-01-30 MED ORDER — PROPOFOL 10 MG/ML IV BOLUS
INTRAVENOUS | Status: AC
  Filled 2020-01-30: qty 20

## 2020-01-30 MED ORDER — FENTANYL CITRATE (PF) 250 MCG/5ML IJ SOLN
INTRAMUSCULAR | Status: AC
  Filled 2020-01-30: qty 5

## 2020-01-30 MED ORDER — MEPERIDINE HCL 50 MG/ML IJ SOLN: 6.2500 mg | INTRAMUSCULAR | Status: DC | PRN

## 2020-01-30 MED ORDER — LACTATED RINGERS IV SOLN: INTRAVENOUS | Status: DC | PRN

## 2020-01-30 MED ORDER — MIDAZOLAM HCL 5 MG/5ML IJ SOLN
INTRAMUSCULAR | Status: DC | PRN
  Administered 2020-01-30: 2 mg via INTRAVENOUS

## 2020-01-30 MED ORDER — HYDROCODONE-ACETAMINOPHEN 5-325 MG PO TABS: 1.0000 | ORAL_TABLET | ORAL | 0 refills | Status: DC | PRN

## 2020-01-30 MED ORDER — LACTATED RINGERS IV SOLN: INTRAVENOUS | Status: DC

## 2020-01-30 MED ORDER — ONDANSETRON HCL 4 MG/2ML IJ SOLN
INTRAMUSCULAR | Status: DC | PRN
  Administered 2020-01-30: 4 mg via INTRAVENOUS

## 2020-01-30 MED ORDER — 0.9 % SODIUM CHLORIDE (POUR BTL) OPTIME
TOPICAL | Status: DC | PRN
  Administered 2020-01-30: 1000 mL

## 2020-01-30 MED ORDER — GLYCOPYRROLATE PF 0.2 MG/ML IJ SOSY
PREFILLED_SYRINGE | INTRAMUSCULAR | Status: DC | PRN
  Administered 2020-01-30: .2 mg via INTRAVENOUS

## 2020-01-30 MED ORDER — PROPOFOL 10 MG/ML IV BOLUS
INTRAVENOUS | Status: DC | PRN
  Administered 2020-01-30: 200 mg via INTRAVENOUS

## 2020-01-30 MED ORDER — MIDAZOLAM HCL 2 MG/2ML IJ SOLN
INTRAMUSCULAR | Status: AC
  Filled 2020-01-30: qty 2

## 2020-01-30 MED ORDER — DEXAMETHASONE SODIUM PHOSPHATE 10 MG/ML IJ SOLN
INTRAMUSCULAR | Status: DC | PRN
  Administered 2020-01-30: 10 mg via INTRAVENOUS

## 2020-01-30 MED ORDER — FENTANYL CITRATE (PF) 100 MCG/2ML IJ SOLN
INTRAMUSCULAR | Status: DC | PRN
  Administered 2020-01-30 (×5): 50 ug via INTRAVENOUS

## 2020-01-30 MED ORDER — DEXAMETHASONE SODIUM PHOSPHATE 10 MG/ML IJ SOLN
INTRAMUSCULAR | Status: AC
  Filled 2020-01-30: qty 1

## 2020-01-30 MED ORDER — ONDANSETRON HCL 4 MG/2ML IJ SOLN: 4.0000 mg | Freq: Once | INTRAMUSCULAR | Status: DC | PRN

## 2020-01-30 MED ORDER — LIDOCAINE 2% (20 MG/ML) 5 ML SYRINGE
INTRAMUSCULAR | Status: AC
  Filled 2020-01-30: qty 5

## 2020-01-30 MED ORDER — ONDANSETRON HCL 4 MG/2ML IJ SOLN
INTRAMUSCULAR | Status: AC
  Filled 2020-01-30: qty 2

## 2020-01-30 MED ORDER — LIDOCAINE 2% (20 MG/ML) 5 ML SYRINGE
INTRAMUSCULAR | Status: DC | PRN
  Administered 2020-01-30: 100 mg via INTRAVENOUS

## 2020-01-30 MED ORDER — IBUPROFEN 200 MG PO TABS: 200.0000 mg | ORAL_TABLET | Freq: Four times a day (QID) | ORAL | Status: DC | PRN

## 2020-01-30 SURGICAL SUPPLY — 28 items
BLADE SURG 15 STRL LF DISP TIS (BLADE) ×1 IMPLANT
BLADE SURG 15 STRL SS (BLADE) ×1
BNDG COHESIVE 1X5 TAN STRL LF (GAUZE/BANDAGES/DRESSINGS) ×2 IMPLANT
COVER SURGICAL LIGHT HANDLE (MISCELLANEOUS) ×2 IMPLANT
COVER WAND RF STERILE (DRAPES) IMPLANT
DECANTER SPIKE VIAL GLASS SM (MISCELLANEOUS) ×2 IMPLANT
DRAPE LAPAROTOMY T 98X78 PEDS (DRAPES) ×2 IMPLANT
DRSG VASELINE 3X18 (GAUZE/BANDAGES/DRESSINGS) ×1 IMPLANT
ELECT PENCIL ROCKER SW 15FT (MISCELLANEOUS) ×1 IMPLANT
ELECT REM PT RETURN 15FT ADLT (MISCELLANEOUS) ×2 IMPLANT
GAUZE 4X4 16PLY RFD (DISPOSABLE) ×2 IMPLANT
GAUZE PETROLATUM 1 X8 (GAUZE/BANDAGES/DRESSINGS) ×2 IMPLANT
GLOVE BIO SURGEON STRL SZ7.5 (GLOVE) ×2 IMPLANT
GOWN STRL REUS W/TWL LRG LVL3 (GOWN DISPOSABLE) ×2 IMPLANT
KIT BASIN OR (CUSTOM PROCEDURE TRAY) ×2 IMPLANT
KIT TURNOVER KIT A (KITS) IMPLANT
NEEDLE HYPO 22GX1.5 SAFETY (NEEDLE) ×2 IMPLANT
NS IRRIG 1000ML POUR BTL (IV SOLUTION) ×1 IMPLANT
PACK BASIC VI WITH GOWN DISP (CUSTOM PROCEDURE TRAY) ×2 IMPLANT
PENCIL SMOKE EVACUATOR (MISCELLANEOUS) IMPLANT
SUT CHROMIC 3 0 PS 2 (SUTURE) IMPLANT
SUT CHROMIC 3 0 SH 27 (SUTURE) ×4 IMPLANT
SUT SILK 2 0 (SUTURE)
SUT SILK 2-0 18XBRD TIE 12 (SUTURE) IMPLANT
SYR CONTROL 10ML LL (SYRINGE) ×1 IMPLANT
TOWEL OR 17X26 10 PK STRL BLUE (TOWEL DISPOSABLE) ×2 IMPLANT
TOWEL OR NON WOVEN STRL DISP B (DISPOSABLE) ×2 IMPLANT
WATER STERILE IRR 1000ML POUR (IV SOLUTION) IMPLANT

## 2020-01-30 NOTE — Transfer of Care (Signed)
Immediate Anesthesia Transfer of Care Note  Patient: Jairo Ben  Procedure(s) Performed: CIRCUMCISION REVISION (N/A )  Patient Location: PACU  Anesthesia Type:General  Level of Consciousness: awake, alert , oriented and patient cooperative  Airway & Oxygen Therapy: Patient Spontanous Breathing and Patient connected to face mask oxygen  Post-op Assessment: Report given to RN, Post -op Vital signs reviewed and stable and Patient moving all extremities  Post vital signs: Reviewed and stable  Last Vitals:  Vitals Value Taken Time  BP 108/82 01/30/20 1052  Temp    Pulse 82 01/30/20 1053  Resp 12 01/30/20 1053  SpO2 100 % 01/30/20 1053  Vitals shown include unvalidated device data.  Last Pain:  Vitals:   01/30/20 1052  TempSrc:   PainSc: (P) 0-No pain         Complications: No apparent anesthesia complications

## 2020-01-30 NOTE — Discharge Instructions (Addendum)
Take the dressing off in 2 days.  If the dressing comes off, do not worry about it.  Just leave it off.  If the dressing feels too tight, just take off the dressing.  Once the dressing is off, apply Neosporin or bacitracin to the stitch line 2 times a day until healed

## 2020-01-30 NOTE — Op Note (Signed)
Operative Note  Preoperative diagnosis:  1.  Recurrent balanitis with redundant foreskin  Postoperative diagnosis: 1.  Same  Procedure(s): 1.  Circumcision  Surgeon: Link Snuffer, MD  Assistants: None  Anesthesia: General  Complications: None immediate  EBL: 20 cc  Specimens: 1.  None  Drains/Catheters: 1.  None  Intraoperative findings: Redundant foreskin excised  Indication: 39YO male with recurrent balanitis and redundant foreskin desires circumcision.  Description of procedure:  The patient was identified and consent was obtained.  The patient was taken to the operating room and placed in the supine position.  The patient was placed under general anesthesia.  Perioperative antibiotics were administered.  The patient was placed in supine position.  Patient was prepped and draped in a standard sterile fashion and a timeout was performed.  Perioperative antibiotics were administered.  Foreskin adhesions were released with a hemostat.  The foreskin was then retracted and Betadine was applied to the inner portion of the foreskin the head of the penis.  A marking pen was used to mark out a circumferential line approximate 1 cm proximal to the corona.  A scalpel was used to sharply incise along this line.  The foreskin was then returned to its normal position and another circumferential line was made along the level of the corona with a marking pen.  A scalpel was again used to sharply incise along this line.  A straight clamp was used to clamp the midline of the foreskin.  This was then divided.  The excess foreskin was then removed with Bovie electrocautery taking great care not to come close to the glans.  The foreskin was discarded.  Hemostasis was obtained with Bovie electrocautery.  The skin was then reapproximated with interrupted 3-0 chromic sutures and a dressing was applied.  This concluded the operation.  The patient tolerated the procedure well was stable  postoperatively.  Plan: Patient will return in several weeks for a postoperative check.

## 2020-01-30 NOTE — Anesthesia Preprocedure Evaluation (Signed)
Anesthesia Evaluation  Patient identified by MRN, date of birth, ID band Patient awake    Reviewed: Allergy & Precautions, NPO status , Patient's Chart, lab work & pertinent test results  Airway Mallampati: I       Dental no notable dental hx. (+) Teeth Intact   Pulmonary sleep apnea , former smoker,    Pulmonary exam normal breath sounds clear to auscultation       Cardiovascular negative cardio ROS Normal cardiovascular exam Rhythm:Regular Rate:Normal     Neuro/Psych PSYCHIATRIC DISORDERS Bipolar Disorder negative neurological ROS     GI/Hepatic negative GI ROS, Neg liver ROS,   Endo/Other  Hypothyroidism Morbid obesity  Renal/GU negative Renal ROS  negative genitourinary   Musculoskeletal negative musculoskeletal ROS (+)   Abdominal (+) + obese,   Peds negative pediatric ROS (+)  Hematology negative hematology ROS (+)   Anesthesia Other Findings   Reproductive/Obstetrics negative OB ROS                             Anesthesia Physical Anesthesia Plan  ASA: III  Anesthesia Plan: General   Post-op Pain Management:    Induction: Intravenous  PONV Risk Score and Plan: 4 or greater and Ondansetron, Dexamethasone and Midazolam  Airway Management Planned: LMA  Additional Equipment: None  Intra-op Plan:   Post-operative Plan:   Informed Consent: I have reviewed the patients History and Physical, chart, labs and discussed the procedure including the risks, benefits and alternatives for the proposed anesthesia with the patient or authorized representative who has indicated his/her understanding and acceptance.     Dental advisory given  Plan Discussed with: CRNA  Anesthesia Plan Comments:         Anesthesia Quick Evaluation

## 2020-01-30 NOTE — Anesthesia Procedure Notes (Signed)
Procedure Name: LMA Insertion Date/Time: 01/30/2020 9:50 AM Performed by: Mitzie Na, CRNA Pre-anesthesia Checklist: Patient identified, Emergency Drugs available, Suction available and Patient being monitored Patient Re-evaluated:Patient Re-evaluated prior to induction Oxygen Delivery Method: Circle system utilized Preoxygenation: Pre-oxygenation with 100% oxygen Induction Type: IV induction LMA: LMA with gastric port inserted LMA Size: 5.0 Number of attempts: 1 Airway Equipment and Method: Oral airway Placement Confirmation: positive ETCO2 and breath sounds checked- equal and bilateral Tube secured with: Tape Dental Injury: Teeth and Oropharynx as per pre-operative assessment

## 2020-01-30 NOTE — H&P (Signed)
CC/HPI: CC: Split stream, redundant foreskin, irritation of foreskin  HPI:  39 year old male has had problems with a split stream for what he states is all his life. He has a partial circumcision. He is able to pull back his foreskin but he has some pain in irritation with the foreskin. He would like to have completion circumcision. The split stream is very bothersome to him in terms of not being able to void adequately. He has to clean up after himself quite often.     ALLERGIES: Dilaudid - Itching    MEDICATIONS: Levothyroxine Sodium 100 mcg tablet  Lamotrigine 100 mg tablet  Lithium Carbonate Er 450 mg tablet, extended release     GU PSH: None     PSH Notes: ACDF , rt eye surgery as a child for amblyopia   NON-GU PSH: Neck Surgery (Unspecified) - about 2018     GU PMH: None     PMH Notes: MRSA 2010    NON-GU PMH: Anxiety Bipolar disorder, unspecified Depression Hypothyroidism Sleep Apnea    FAMILY HISTORY: Uterine Cancer - Mother   SOCIAL HISTORY: Marital Status: Single Preferred Language: English; Race: White Current Smoking Status: Patient smokes. Has smoked since 01/06/2005. Smokes 1 pack per day.   Tobacco Use Assessment Completed: Used Tobacco in last 30 days? Has never drank.  Does not drink caffeine. 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    REVIEW OF SYSTEMS:    GU Review Male:   Patient reports leakage of urine. Patient denies frequent urination, hard to postpone urination, burning/ pain with urination, get up at night to urinate, stream starts and stops, trouble starting your stream, have to strain to urinate , erection problems, and penile pain.  Gastrointestinal (Upper):   Patient reports indigestion/ heartburn. Patient denies vomiting and nausea.  Gastrointestinal (Lower):   Patient reports diarrhea. Patient denies constipation.  Constitutional:   Patient reports fever and fatigue. Patient denies night sweats and weight loss.  Skin:   Patient denies skin  rash/ lesion and itching.  Eyes:   Patient denies blurred vision and double vision.  Ears/ Nose/ Throat:   Patient denies sore throat and sinus problems.  Hematologic/Lymphatic:   Patient denies swollen glands and easy bruising.  Cardiovascular:   Patient denies leg swelling and chest pains.  Respiratory:   Patient denies cough and shortness of breath.  Endocrine:   Patient denies excessive thirst.  Musculoskeletal:   Patient denies back pain and joint pain.  Neurological:   Patient reports headaches. Patient denies dizziness.  Psychologic:   Patient denies depression and anxiety.   VITAL SIGNS:      01/17/2020 11:09 AM  Weight 312 lb / 141.52 kg  Height 72 in / 182.88 cm  BP 123/79 mmHg  Pulse 61 /min  Temperature 97.3 F / 36.2 C  BMI 42.3 kg/m   GU PHYSICAL EXAMINATION:    Testes: No tenderness, no swelling, no enlargement left testes. No tenderness, no swelling, no enlargement right testes. Normal location left testes. Normal location right testes. No mass, no cyst, no varicocele, no hydrocele left testes. No mass, no cyst, no varicocele, no hydrocele right testes.  Urethral Meatus: Normal size. No lesion, no wart, no discharge, no polyp. Normal location.  Penis: Penis uncircumcised. Mild balanitis, no meatal stenosis.    MULTI-SYSTEM PHYSICAL EXAMINATION:    Constitutional: Well-nourished. No physical deformities. Normally developed. Good grooming.  Respiratory: No labored breathing, no use of accessory muscles.   Cardiovascular: Normal temperature, adequate perfusion of extremities  Skin: No paleness, no jaundice  Neurologic / Psychiatric: Oriented to time, oriented to place, oriented to person. No depression, no anxiety, no agitation.  Gastrointestinal: No mass, no tenderness, no rigidity, obese abdomen.   Eyes: Normal conjunctivae. Normal eyelids.  Musculoskeletal: Normal gait and station of head and neck.     PAST DATA REVIEWED:  Source Of History:  Patient  Records  Review:   Previous Doctor Records, Previous Patient Records  Urine Test Review:   Urinalysis   PROCEDURES:         Flexible Cystoscopy - 52000  Risks, benefits, and some of the potential complications of the procedure were discussed at length with the patient including infection, bleeding, voiding discomfort, urinary retention, fever, chills, sepsis, and others. All questions were answered. Informed consent was obtained. Antibiotic prophylaxis was given. Sterile technique and intraurethral analgesia were used.  Meatus:  Normal size. Normal location. Normal condition.  Urethra:  No strictures.  External Sphincter:  Normal.  Verumontanum:  Normal.  Prostate:  Non-obstructing. No hyperplasia.  Bladder Neck:  Non-obstructing.  Ureteral Orifices:  Normal location. Normal size. Normal shape.  Bladder:  No trabeculation. No tumors. Normal mucosa. No stones.      The lower urinary tract was carefully examined. The procedure was well-tolerated and without complications. Antibiotic instructions were given. Instructions were given to call the office immediately for bloody urine, difficulty urinating, urinary retention, painful or frequent urination, fever, chills, nausea, vomiting or other illness. The patient stated that he understood these instructions and would comply with them.        PVR Ultrasound - [id number]  Scanned Volume: 43 cc         Urinalysis - 81003 Dipstick Dipstick Cont'd  Color: Yellow Bilirubin: Neg mg/dL  Appearance: Clear Ketones: Neg mg/dL  Specific Gravity: 1.015 Blood: Neg ery/uL  pH: 6.0 Protein: Neg mg/dL  Glucose: Neg mg/dL Urobilinogen: 0.2 mg/dL    Nitrites: Neg    Leukocyte Esterase: Neg leu/uL    ASSESSMENT:      ICD-10 Details  1 GU:   Splitting of urinary stream - R39.13 Undiagnosed New Problem  2   Balanitis - N48.1 Undiagnosed New Problem   PLAN:            Medications New Meds: Tamsulosin Hcl 0.4 mg capsule 1 capsule PO Daily   #30  6 Refill(s)             Document Letter(s):  Created for Patient: Clinical Summary         Notes:   As a first-line treatment option for his lower urinary tract symptoms have discussed conservative therapy such as behavioral management as well as pharmacological options including alpha blockers. The patient has elected to undergo trial of an alpha blocker. He understands the potential serious adverse reactions including allergic reaction, syncopal episodes, orthostatic hypotension as well as other side effects such as the potential of retrograde ejaculation.   He would like to proceed with circumcision. He understands potential for bleeding, infection, injury to surrounding structures, wound dehiscence, need for additional procedures, potential of taking too much skin, iatrogenic chordee, among other potential complications.   Cc: Dr. Ethelene Hal    Signed by Link Snuffer, III, M.D. on 01/17/20 at 12:14 PM (EST

## 2020-01-31 ENCOUNTER — Encounter: Payer: Self-pay | Admitting: *Deleted

## 2020-02-01 NOTE — Anesthesia Postprocedure Evaluation (Signed)
Anesthesia Post Note  Patient: Mitchell Ray  Procedure(s) Performed: CIRCUMCISION REVISION (N/A )     Patient location during evaluation: PACU Anesthesia Type: General Level of consciousness: awake Pain management: pain level controlled Vital Signs Assessment: post-procedure vital signs reviewed and stable Respiratory status: spontaneous breathing Cardiovascular status: stable Postop Assessment: no apparent nausea or vomiting Anesthetic complications: no    Last Vitals:  Vitals:   01/30/20 1115 01/30/20 1120  BP: 116/78 128/83  Pulse: 66   Resp: 12   Temp: 36.4 C   SpO2: 98%     Last Pain:  Vitals:   01/30/20 1120  TempSrc:   PainSc: 1    Pain Goal:                   Huston Foley

## 2020-02-13 ENCOUNTER — Telehealth: Payer: Self-pay | Admitting: Family Medicine

## 2020-02-13 NOTE — Telephone Encounter (Signed)
Spoke with Joellen Jersey who verbally understood forms were received today. I will give to Dr. Ethelene Hal to fill out and have them faxed.

## 2020-02-13 NOTE — Telephone Encounter (Signed)
Mitchell Ray was calling and stated that she faxed over surgical clearance form for patient and wanted to see if Dr. Ethelene Hal could received it. CB is [phone].

## 2020-04-09 ENCOUNTER — Other Ambulatory Visit: Payer: Self-pay

## 2020-04-09 ENCOUNTER — Ambulatory Visit (INDEPENDENT_AMBULATORY_CARE_PROVIDER_SITE_OTHER): Payer: 59 | Admitting: Family Medicine

## 2020-04-09 ENCOUNTER — Encounter: Payer: Self-pay | Admitting: Family Medicine

## 2020-04-09 VITALS — BP 100/64 | HR 64 | Temp 97.2°F | Ht 72.0 in | Wt 291.2 lb

## 2020-04-09 DIAGNOSIS — D229 Melanocytic nevi, unspecified: Secondary | ICD-10-CM | POA: Diagnosis not present

## 2020-04-09 DIAGNOSIS — E039 Hypothyroidism, unspecified: Secondary | ICD-10-CM | POA: Diagnosis not present

## 2020-04-09 DIAGNOSIS — L739 Follicular disorder, unspecified: Secondary | ICD-10-CM

## 2020-04-09 LAB — TSH: TSH: 3.72 u[IU]/mL (ref 0.35–4.50)

## 2020-04-09 MED ORDER — DOXYCYCLINE HYCLATE 100 MG PO TABS: 100.0000 mg | ORAL_TABLET | Freq: Two times a day (BID) | ORAL | 0 refills | Status: DC

## 2020-04-09 NOTE — Patient Instructions (Signed)
Exercising to Stay Healthy To become healthy and stay healthy, it is recommended that you do moderate-intensity and vigorous-intensity exercise. You can tell that you are exercising at a moderate intensity if your heart starts beating faster and you start breathing faster but can still hold a conversation. You can tell that you are exercising at a vigorous intensity if you are breathing much harder and faster and cannot hold a conversation while exercising. Exercising regularly is important. It has many health benefits, such as:  Improving overall fitness, flexibility, and endurance.  Increasing bone density.  Helping with weight control.  Decreasing body fat.  Increasing muscle strength.  Reducing stress and tension.  Improving overall health. How often should I exercise? Choose an activity that you enjoy, and set realistic goals. Your health care provider can help you make an activity plan that works for you. Exercise regularly as told by your health care provider. This may include:  Doing strength training two times a week, such as: ? Lifting weights. ? Using resistance bands. ? Push-ups. ? Sit-ups. ? Yoga.  Doing a certain intensity of exercise for a given amount of time. Choose from these options: ? A total of 150 minutes of moderate-intensity exercise every week. ? A total of 75 minutes of vigorous-intensity exercise every week. ? A mix of moderate-intensity and vigorous-intensity exercise every week. Children, pregnant women, people who have not exercised regularly, people who are overweight, and older adults may need to talk with a health care provider about what activities are safe to do. If you have a medical condition, be sure to talk with your health care provider before you start a new exercise program. What are some exercise ideas? Moderate-intensity exercise ideas include:  Walking 1 mile (1.6 km) in about 15  minutes.  Biking.  Hiking.  Golfing.  Dancing.  Water aerobics. Vigorous-intensity exercise ideas include:  Walking 4.5 miles (7.2 km) or more in about 1 hour.  Jogging or running 5 miles (8 km) in about 1 hour.  Biking 10 miles (16.1 km) or more in about 1 hour.  Lap swimming.  Roller-skating or in-line skating.  Cross-country skiing.  Vigorous competitive sports, such as football, basketball, and soccer.  Jumping rope.  Aerobic dancing. What are some everyday activities that can help me to get exercise?  Yard work, such as: ? Pushing a lawn mower. ? Raking and bagging leaves.  Washing your car.  Pushing a stroller.  Shoveling snow.  Gardening.  Washing windows or floors. How can I be more active in my day-to-day activities?  Use stairs instead of an elevator.  Take a walk during your lunch break.  If you drive, park your car farther away from your work or school.  If you take public transportation, get off one stop early and walk the rest of the way.  Stand up or walk around during all of your indoor phone calls.  Get up, stretch, and walk around every 30 minutes throughout the day.  Enjoy exercise with a friend. Support to continue exercising will help you keep a regular routine of activity. What guidelines can I follow while exercising?  Before you start a new exercise program, talk with your health care provider.  Do not exercise so much that you hurt yourself, feel dizzy, or get very short of breath.  Wear comfortable clothes and wear shoes with good support.  Drink plenty of water while you exercise to prevent dehydration or heat stroke.  Work out until your breathing   and your heartbeat get faster. Where to find more information  [place] Department of Health and Human Services: www.hhs.gov  Centers for Disease Control and Prevention (CDC): www.cdc.gov Summary  Exercising regularly is important. It will improve your overall fitness,  flexibility, and endurance.  Regular exercise also will improve your overall health. It can help you control your weight, reduce stress, and improve your bone density.  Do not exercise so much that you hurt yourself, feel dizzy, or get very short of breath.  Before you start a new exercise program, talk with your health care provider. This information is not intended to replace advice given to you by your health care provider. Make sure you discuss any questions you have with your health care provider. Document Revised: 10/07/2017 Document Reviewed: 09/15/2017 Elsevier Patient Education  2020 [employer]  

## 2020-04-09 NOTE — Progress Notes (Signed)
Established Patient Office Visit  Subjective:  Patient ID: Mitchell Ray, male    DOB: 12-01-1980  Age: 39 y.o. MRN: [id number]  CC:  Chief Complaint  Patient presents with  . Follow-up    3 month follow up, pt would ike referral to dermatologist for rash on neck/back of head, would like skin tag removed from chest.     HPI Mitchell Ray presents for follow-up of rash on nape. Is been there for years. Individual lesions that are open and draining pus. It is painful at times. Has a stable mole on his last anterior upper chest that he would like removed. He has quit smoking but desires to start again greatly. He had a circumcision performed and is not particularly satisfied with the result. He is in a depressive phase of his bipolar disease. He is in close contact with his psychologist. Multiple SSRIs have been tried with mixed results. He has been able to lose some weight. But, feels unmotivated to exercise at this time.  Past Medical History:  Diagnosis Date  . ADHD (attention deficit hyperactivity disorder)   . Bipolar 2 disorder ([place])   . Blind right eye   . Sleep apnea     Past Surgical History:  Procedure Laterality Date  . CIRCUMCISION REVISION N/A 01/30/2020   Procedure: CIRCUMCISION REVISION;  Surgeon: Lucas Mallow, MD;  Location: [facility];  Service: Urology;  Laterality: N/A;  . EYE SURGERY     as a child  . INCISE AND DRAIN ABCESS  2010  . SPINE SURGERY      Family History  Problem Relation Age of Onset  . Cancer Mother   . Diabetes Mellitus II Mother   . Hyperlipidemia Mother   . Hypertension Mother   . Healthy Father   . Healthy Brother     Social History   Socioeconomic History  . Marital status: Significant Other    Spouse name: Not on file  . Number of children: Not on file  . Years of education: Not on file  . Highest education level: Not on file  Occupational History  . Not on file  Tobacco Use  . Smoking status: Former Smoker     Packs/day: 1.00    Years: 10.00    Pack years: 10.00    Types: Cigarettes    Quit date: 01/08/2020    Years since quitting: 0.2  . Smokeless tobacco: Never Used  . Tobacco comment: Trying to quit, down to 5 cigarettes daily  Substance and Sexual Activity  . Alcohol use: Yes    Alcohol/week: 0.0 standard drinks    Comment: rarely  . Drug use: No  . Sexual activity: Not on file  Other Topics Concern  . Not on file  Social History Narrative   He works part-time at [employer]   He is a Charity fundraiser at [employer]   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    Social Determinants of Health   Financial Resource Strain:   . Difficulty of Paying Living Expenses:   Food Insecurity:   . Worried About Charity fundraiser in the Last Year:   . Arboriculturist in the Last Year:   Transportation Needs:   . Film/video editor (Medical):   Marland Kitchen Lack of Transportation (Non-Medical):   Physical Activity:   . Days of Exercise per Week:   . Minutes of Exercise per Session:   Stress:   . Feeling of Stress :  Social Connections:   . Frequency of Communication with Friends and Family:   . Frequency of Social Gatherings with Friends and Family:   . Attends Religious Services:   . Active Member of Clubs or Organizations:   . Attends Archivist Meetings:   Marland Kitchen Marital Status:   Intimate Partner Violence:   . Fear of Current or Ex-Partner:   . Emotionally Abused:   Marland Kitchen Physically Abused:   . Sexually Abused:     Outpatient Medications Prior to Visit  Medication Sig Dispense Refill  . lamoTRIgine (LAMICTAL) 100 MG tablet Take 100 mg by mouth 2 (two) times daily.     Marland Kitchen levothyroxine (SYNTHROID) 100 MCG tablet Take 100 mcg by mouth daily before breakfast.    . lithium carbonate (ESKALITH) 450 MG CR tablet Take 900 mg by mouth daily.     . calcium elemental as carbonate (BARIATRIC TUMS ULTRA) 400 MG chewable tablet Chew 3 tablets by mouth at bedtime.     Marland Kitchen  HYDROcodone-acetaminophen (NORCO) 5-325 MG tablet Take 1 tablet by mouth every 4 (four) hours as needed for moderate pain. (Patient not taking: Reported on 04/09/2020) 10 tablet 0  . ibuprofen (ADVIL) 200 MG tablet Take 400 mg by mouth daily as needed for headache or moderate pain.    . nicotine (NICOTROL) 10 MG inhaler Inhale 1 Cartridge (1 continuous puffing total) into the lungs as needed for smoking cessation. (Patient not taking: Reported on 04/09/2020) 42 each 1  . tamsulosin (FLOMAX) 0.4 MG CAPS capsule Take 0.4 mg by mouth daily.     No facility-administered medications prior to visit.    Allergies  Allergen Reactions  . Dilaudid [Hydromorphone Hcl] Itching  . Adhesive [Tape] Rash    ROS Review of Systems  Constitutional: Negative.   HENT: Negative.   Eyes: Negative for photophobia and visual disturbance.  Respiratory: Negative.   Cardiovascular: Negative.   Gastrointestinal: Negative.   Endocrine: Negative for polyphagia and polyuria.  Genitourinary: Negative.   Musculoskeletal: Negative for gait problem and joint swelling.  Skin: Positive for color change and rash.  Allergic/Immunologic: Negative for immunocompromised state.  Neurological: Negative for tremors and speech difficulty.  Hematological: Does not bruise/bleed easily.  Psychiatric/Behavioral: Positive for dysphoric mood. The patient is nervous/anxious.       Objective:    Physical Exam  Constitutional: He is oriented to person, place, and time. He appears well-developed and well-nourished. No distress.  HENT:  Head: Normocephalic and atraumatic.  Right Ear: External ear normal.  Left Ear: External ear normal.  Eyes: Conjunctivae are normal. Right eye exhibits no discharge. Left eye exhibits no discharge. No scleral icterus.  Neck: No JVD present. No tracheal deviation present. No thyromegaly present.  Cardiovascular: Normal rate, regular rhythm and normal heart sounds.  Pulmonary/Chest: Effort normal and  breath sounds normal. No stridor.  Musculoskeletal:        General: No edema.     Cervical back: Neck supple.  Lymphadenopathy:    He has no cervical adenopathy.  Neurological: He is alert and oriented to person, place, and time.  Skin: He is not diaphoretic.     Psychiatric: He has a normal mood and affect. His behavior is normal.    BP 100/64   Pulse 64   Temp (!) 97.2 F (36.2 C) (Tympanic)   Ht 6' (1.829 m)   Wt 291 lb 3.2 oz (132.1 kg)   SpO2 96%   BMI 39.49 kg/m  Wt Readings from Last 3  Encounters:  04/09/20 291 lb 3.2 oz (132.1 kg)  01/25/20 (!) 314 lb (142.4 kg)  01/23/20 (!) 316 lb (143.3 kg)     Health Maintenance Due  Topic Date Due  . TETANUS/TDAP  Never done    There are no preventive care reminders to display for this patient.  Lab Results  Component Value Date   TSH 3.09 01/07/2020   Lab Results  Component Value Date   WBC 8.9 01/25/2020   HGB 14.3 01/25/2020   HCT 44.3 01/25/2020   MCV 94.9 01/25/2020   PLT 292 01/25/2020   Lab Results  Component Value Date   NA 142 01/07/2020   K 3.9 01/07/2020   CO2 28 01/07/2020   GLUCOSE 97 01/07/2020   BUN 9 01/07/2020   CREATININE 1.02 01/07/2020   BILITOT 0.7 01/07/2020   ALKPHOS 54 01/07/2020   AST 21 01/07/2020   ALT 37 01/07/2020   PROT 6.7 01/07/2020   ALBUMIN 4.2 01/07/2020   CALCIUM 9.4 01/07/2020   ANIONGAP 6 05/21/2015   GFR 81.25 01/07/2020   Lab Results  Component Value Date   CHOL 175 01/07/2020   Lab Results  Component Value Date   HDL 26.40 (L) 01/07/2020   Lab Results  Component Value Date   LDLCALC 117 (H) 01/07/2020   Lab Results  Component Value Date   TRIG 159.0 (H) 01/07/2020   Lab Results  Component Value Date   CHOLHDL 7 01/07/2020   No results found for: HGBA1C    Assessment & Plan:   Problem List Items Addressed This Visit      Endocrine   Acquired hypothyroidism - Primary   Relevant Orders   TSH    Other Visit Diagnoses    Folliculitis        Relevant Medications   doxycycline (VIBRA-TABS) 100 MG tablet   Other Relevant Orders   Ambulatory referral to Dermatology   Atypical nevus       Relevant Orders   Ambulatory referral to Dermatology      Meds ordered this encounter  Medications  . doxycycline (VIBRA-TABS) 100 MG tablet    Sig: Take 1 tablet (100 mg total) by mouth 2 (two) times daily. Avoid sun exposure while on medication.    Dispense:  20 tablet    Refill:  0    Follow-up: Return in about 3 months (around 07/10/2020), or if symptoms worsen or fail to improve, for HANG IN THERE!Marland Kitchen  Consider sooner follow-up with psychiatry and psychology. Continue with weight loss efforts. Continue smoking cessation. Please try to continue exercising for mood elevation. He was giving information on exercising for health.  Libby Maw, MD

## 2020-04-10 MED ORDER — LEVOTHYROXINE SODIUM 100 MCG PO TABS: 100.0000 ug | ORAL_TABLET | Freq: Every day | ORAL | 1 refills | Status: AC

## 2020-04-10 NOTE — Addendum Note (Signed)
Addended by: Jon Billings on: 04/10/2020 12:58 PM   Modules accepted: Orders

## 2023-01-18 ENCOUNTER — Other Ambulatory Visit (HOSPITAL_COMMUNITY): Payer: Self-pay

## 2023-01-20 ENCOUNTER — Telehealth: Payer: Self-pay

## 2023-01-20 ENCOUNTER — Other Ambulatory Visit: Payer: Self-pay

## 2023-01-20 ENCOUNTER — Other Ambulatory Visit (HOSPITAL_COMMUNITY)
Admission: RE | Admit: 2023-01-20 | Discharge: 2023-01-20 | Disposition: A | Discharge status: Home / Self Care | Payer: 59 | Source: Ambulatory Visit | Attending: Internal Medicine | Admitting: Internal Medicine

## 2023-01-20 ENCOUNTER — Other Ambulatory Visit (HOSPITAL_COMMUNITY): Payer: Self-pay

## 2023-01-20 ENCOUNTER — Ambulatory Visit (INDEPENDENT_AMBULATORY_CARE_PROVIDER_SITE_OTHER): Payer: 59 | Admitting: Internal Medicine

## 2023-01-20 ENCOUNTER — Encounter: Payer: Self-pay | Admitting: Internal Medicine

## 2023-01-20 ENCOUNTER — Ambulatory Visit (INDEPENDENT_AMBULATORY_CARE_PROVIDER_SITE_OTHER): Payer: 59

## 2023-01-20 VITALS — BP 125/78 | HR 71 | Resp 16 | Ht 72.0 in | Wt 289.0 lb

## 2023-01-20 DIAGNOSIS — Z23 Encounter for immunization: Secondary | ICD-10-CM

## 2023-01-20 DIAGNOSIS — Z7252 High risk homosexual behavior: Secondary | ICD-10-CM | POA: Insufficient documentation

## 2023-01-20 DIAGNOSIS — Z7251 High risk heterosexual behavior: Secondary | ICD-10-CM | POA: Insufficient documentation

## 2023-01-20 NOTE — Telephone Encounter (Signed)
[facility] Patient Advocate Encounter ? ?Insurance verification completed.   ? ?The patient is uninsured and will need patient assistance for medication. ? ?We can complete the application and will need to meet with the patient for signatures and income documentation. ? ?Savera Donson, CPhT ?Specialty Pharmacy Patient Advocate ?[facility] ?Phone: [phone] ?Fax:  [phone]  ?

## 2023-01-20 NOTE — Progress Notes (Signed)
[facility]      Reason for Consult:Prep consultation    Referring Physician: Edmon Crape, [facility]    Patient ID: Mitchell Ray, male    DOB: Sep 02, 1981, 42 y.o.   MRN: [id number]  HPI:   Mitchell Ray is here for consideration of PrEP.  His risk factors include MSM activity.  He has just recently learned about Prep and has been talking to a potential partner who is HIV positive on treatment.  No history of gonorrhea, chlamydia or syphilis but has had a recent gonorrhea exposure.  No symptoms of penile discharge, no rash.  He has been tested recently for HIV and was negative last month.  RPR also negative then and hepatitis B surface Ab positive, core and Ag negative, HCV ab negative.  Is interested in injectable if possible   Past Medical History:  Diagnosis Date   ADHD (attention deficit hyperactivity disorder)    Bipolar 2 disorder ([place])    Blind right eye    Sleep apnea     Prior to Admission medications   Medication Sig Start Date End Date Taking? Authorizing Provider  calcium elemental as carbonate (BARIATRIC TUMS ULTRA) 400 MG chewable tablet Chew 3 tablets by mouth at bedtime.    Yes [provider]  CAPLYTA 42 MG capsule one capsule (42 mg dose).   Yes [provider]  lamoTRIgine (LAMICTAL) 100 MG tablet Take 100 mg by mouth 2 (two) times daily.    Yes [provider]  levothyroxine (SYNTHROID) 100 MCG tablet Take 1 tablet (100 mcg total) by mouth daily before breakfast. 04/10/20  Yes Libby Maw, MD  lithium carbonate (ESKALITH) 450 MG CR tablet Take 900 mg by mouth daily.    Yes [provider]  ibuprofen (ADVIL) 200 MG tablet Take 400 mg by mouth daily as needed for headache or moderate pain. Patient not taking: Reported on 01/20/2023    [provider]    Allergies  Allergen Reactions   Dilaudid [Hydromorphone Hcl] Itching   Adhesive [Tape] Rash    Social History   Tobacco Use   Smoking  status: Former    Packs/day: 1.00    Years: 10.00    Additional pack years: 0.00    Total pack years: 10.00    Types: Cigarettes    Quit date: 01/08/2020    Years since quitting: 3.0    Passive exposure: Past   Smokeless tobacco: Never  Vaping Use   Vaping Use: Never used  Substance Use Topics   Alcohol use: Yes    Alcohol/week: 0.0 standard drinks of alcohol    Comment: rarely   Drug use: No    Family History  Problem Relation Age of Onset   Cancer Mother    Diabetes Mellitus II Mother    Hyperlipidemia Mother    Hypertension Mother    Healthy Father    Healthy Brother      Review of Systems  Constitutional: negative for sweats and fatigue All other systems reviewed and are negative    Constitutional: in no apparent distress  Vitals:   01/20/23 0851  BP: 125/78  Pulse: 71  Resp: 16  SpO2: 97%   EYES: anicteric Respiratory: clear Musculoskeletal: no edema  Labs: Lab Results  Component Value Date   WBC 8.9 01/25/2020   HGB 14.3 01/25/2020   HCT 44.3 01/25/2020   MCV 94.9 01/25/2020   PLT 292 01/25/2020    Lab Results  Component Value Date  CREATININE 1.02 01/07/2020   BUN 9 01/07/2020   NA 142 01/07/2020   K 3.9 01/07/2020   CL 107 01/07/2020   CO2 28 01/07/2020    Lab Results  Component Value Date   ALT 37 01/07/2020   AST 21 01/07/2020   ALKPHOS 54 01/07/2020   BILITOT 0.7 01/07/2020     Assessment: high risk sexual activity.  I discussed the options and benefits of PrEP, indications and U=U for any potential partners (undetectable = untransmittable).  I discussed the medication options and side effects.  He was also seen by our pharmacist and counseled. He is interested and will try for Apretude and will submit for a PA.   Will also do STI testing and repeat HIV.   Plan: 1)  labs and STI testing 2) PA per pharmacy Start as soon as approved or with [facility] Follow up after starting

## 2023-01-20 NOTE — Telephone Encounter (Signed)
Counseled that Apretude is one intramuscular injection in the gluteal muscle for each visit. Explained that the second injection is 30 days after the initial injection then every 2 months thereafter. Discussed the importance of maintaining treatment within the two-week treatment window and the patient verified his understanding. Counseled on possible side effects associated with the injections such as injection site pain, which is usually mild to moderate in nature, injection site nodules, and injection site reactions. Told the patient to call the clinic if the patient has any new questions. He is agreeable to either begin oral PrEP or abstain from sexual activity while awaiting insurance approval for Apretude.

## 2023-01-21 LAB — CYTOLOGY, (ORAL, ANAL, URETHRAL) ANCILLARY ONLY
Chlamydia: NEGATIVE
Chlamydia: NEGATIVE
Comment: NEGATIVE
Comment: NEGATIVE
Comment: NORMAL
Comment: NORMAL
Neisseria Gonorrhea: NEGATIVE
Neisseria Gonorrhea: NEGATIVE

## 2023-01-21 LAB — URINE CYTOLOGY ANCILLARY ONLY
Chlamydia: NEGATIVE
Comment: NEGATIVE
Comment: NORMAL
Neisseria Gonorrhea: NEGATIVE

## 2023-01-22 LAB — HIV-1 RNA QUANT-NO REFLEX-BLD
HIV 1 RNA Quant: NOT DETECTED Copies/mL
HIV-1 RNA Quant, Log: NOT DETECTED Log cps/mL

## 2023-01-22 LAB — HIV ANTIBODY (ROUTINE TESTING W REFLEX): HIV 1&2 Ab, 4th Generation: NONREACTIVE

## 2023-01-22 LAB — RPR: RPR Ser Ql: NONREACTIVE

## 2023-01-26 ENCOUNTER — Telehealth: Payer: Self-pay

## 2023-01-26 NOTE — Telephone Encounter (Signed)
[facility] Patient Advocate Encounter   Received notification from [facility] that prior authorization for Apretude is required.   PA submitted on 01/26/23 Key [id number] Status is pending    [facility] will continue to follow.   Ileene Patrick, [facility] Patient [facility] Phone: [phone] Fax:  [phone]

## 2023-01-27 ENCOUNTER — Other Ambulatory Visit (HOSPITAL_COMMUNITY): Payer: Self-pay

## 2023-02-17 ENCOUNTER — Other Ambulatory Visit: Payer: Self-pay

## 2023-02-17 ENCOUNTER — Telehealth: Payer: Self-pay

## 2023-02-17 ENCOUNTER — Other Ambulatory Visit (HOSPITAL_COMMUNITY): Payer: Self-pay

## 2023-02-17 ENCOUNTER — Other Ambulatory Visit: Payer: Self-pay | Admitting: Pharmacist

## 2023-02-17 ENCOUNTER — Ambulatory Visit (INDEPENDENT_AMBULATORY_CARE_PROVIDER_SITE_OTHER): Payer: 59

## 2023-02-17 DIAGNOSIS — Z23 Encounter for immunization: Secondary | ICD-10-CM | POA: Diagnosis not present

## 2023-02-17 DIAGNOSIS — Z79899 Other long term (current) drug therapy: Secondary | ICD-10-CM

## 2023-02-17 MED ORDER — APRETUDE 600 MG/3ML IM SUER
600.0000 mg | INTRAMUSCULAR | 1 refills | Status: DC
  Filled 2023-02-17 – 2023-02-21 (×2): qty 3, 30d supply, fill #0
  Filled 2023-03-17: qty 3, 60d supply, fill #1

## 2023-02-17 MED ORDER — APRETUDE 600 MG/3ML IM SUER
600.0000 mg | INTRAMUSCULAR | 5 refills | Status: DC
  Filled 2023-02-17 – 2023-05-26 (×2): qty 3, 60d supply, fill #0
  Filled 2023-07-25: qty 3, 60d supply, fill #1
  Filled 2023-09-14: qty 3, 60d supply, fill #2
  Filled 2023-11-22: qty 3, 60d supply, fill #3
  Filled 2024-01-16: qty 3, 60d supply, fill #4

## 2023-02-17 NOTE — Telephone Encounter (Signed)
[facility] Patient Advocate Encounter  Prior Authorization for Apretude has been approved. (Pharmacy Benefits)   PA# [id number] Effective dates: 02/17/23 through 02/17/24  Patients co-pay is $0.00.   Prescription can be filled at [facility].  [facility] will continue to follow.  Clearance Coots, CPhT Specialty Pharmacy Patient [facility] Disease Phone: [phone] Fax:  [phone]

## 2023-02-21 ENCOUNTER — Other Ambulatory Visit (HOSPITAL_COMMUNITY): Payer: Self-pay

## 2023-02-21 ENCOUNTER — Telehealth: Payer: Self-pay

## 2023-02-21 ENCOUNTER — Other Ambulatory Visit: Payer: Self-pay

## 2023-02-21 NOTE — Telephone Encounter (Signed)
Attempted to reach Teyo to counsel on starting Apretude, if he is interested. Was not able to leave a voicemail - will send MyChart message direct to patient.   Blane Ohara, PharmD  PGY1 Pharmacy Resident

## 2023-02-22 ENCOUNTER — Telehealth: Payer: Self-pay

## 2023-02-22 NOTE — Telephone Encounter (Signed)
[facility] Patient Advocate Encounter  Patient's medication (Apretude) have been couriered to [facility] from [facility] and will be administered on the patient next office visit .  Chilton Sallade , CPhT Specialty Pharmacy Patient Advocate [facility] Phone: [phone] Fax:  [phone]  

## 2023-02-25 ENCOUNTER — Telehealth: Payer: Self-pay

## 2023-02-25 NOTE — Telephone Encounter (Signed)
Spoke to patient and scheduled his Apretude appointment for Thursday, May 2nd with Marchelle Folks. Patient seemed slightly hesitant. I reminded him this is optional. He wanted to proceed with scheduling. All questions answered.   Blane Ohara, PharmD  PGY1 Pharmacy Resident

## 2023-03-10 ENCOUNTER — Ambulatory Visit (INDEPENDENT_AMBULATORY_CARE_PROVIDER_SITE_OTHER): Payer: 59 | Admitting: Pharmacist

## 2023-03-10 ENCOUNTER — Other Ambulatory Visit: Payer: Self-pay

## 2023-03-10 DIAGNOSIS — Z2981 Encounter for HIV pre-exposure prophylaxis: Secondary | ICD-10-CM

## 2023-03-10 MED ORDER — CABOTEGRAVIR ER 600 MG/3ML IM SUER
600.0000 mg | Freq: Once | INTRAMUSCULAR | Status: AC
Start: 2023-03-10 — End: 2023-03-10
  Administered 2023-03-10: 600 mg via INTRAMUSCULAR

## 2023-03-10 NOTE — Progress Notes (Addendum)
NEW REFERRAL TO CPP CLINIC    HPI: Alamin Mccuiston is a 42 y.o. male who presents to the [facility] pharmacy clinic for Apretude administration and HIV PrEP follow up.  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   CABOTEGRAVIR ER (APRETUDE) 600 MG/3ML INJECTION    Inject 3 mLs (600 mg total) into the muscle every 2 (two) months.   CALCIUM ELEMENTAL AS CARBONATE (BARIATRIC TUMS ULTRA) 400 MG CHEWABLE TABLET    Chew 3 tablets by mouth at bedtime.    CAPLYTA 42 MG CAPSULE    one capsule (42 mg dose).   IBUPROFEN (ADVIL) 200 MG TABLET    Take 400 mg by mouth daily as needed for headache or moderate pain.   LAMOTRIGINE (LAMICTAL) 100 MG TABLET    Take 100 mg by mouth 2 (two) times daily.    LEVOTHYROXINE (SYNTHROID) 100 MCG TABLET    Take 1 tablet (100 mcg total) by mouth daily before breakfast.   LITHIUM CARBONATE (ESKALITH) 450 MG CR TABLET    Take 900 mg by mouth daily.   Modified Medications   No medications on file  Discontinued Medications   No medications on file    Allergies: Allergies  Allergen Reactions   Dilaudid [Hydromorphone Hcl] Itching   Adhesive [Tape] Rash    Past Medical History: Past Medical History:  Diagnosis Date   ADHD (attention deficit hyperactivity disorder)    Bipolar 2 disorder (HCC)    Blind right eye    Sleep apnea     Social History: Social History   Socioeconomic History   Marital status: Significant  Other    Spouse name: Not on file   Number of children: Not on file   Years of education: Not on file   Highest education level: Not on file  Occupational History   Not on file  Tobacco Use   Smoking status: Former    Packs/day: 1.00    Years: 10.00    Additional pack years: 0.00    Total pack years: 10.00    Types: Cigarettes    Quit date: 01/08/2020    Years since quitting: 3.1    Passive exposure: Past   Smokeless tobacco: Never  Vaping Use   Vaping Use: Never used  Substance and Sexual Activity   Alcohol use: Yes    Alcohol/week: 0.0 standard drinks of alcohol    Comment: rarely   Drug use: No   Sexual activity: Not Currently  Other Topics Concern   Not on file  Social History Narrative   He works part-time at [employer]   He is a Physicist, 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    Social Determinants of Health   Financial Resource Strain: Not on file  Food Insecurity: Not on file  Transportation Needs: Not on file  Physical Activity: Not on file  Stress: Not on file  Social Connections: Not on  file    Labs: Lab Results  Component Value Date   HIV1RNAQUANT Not Detected 01/20/2023    RPR and STI Lab Results  Component Value Date   LABRPR NON-REACTIVE 01/20/2023    STI Results GC CT  01/20/2023  9:36 AM Negative    Negative    Negative  Negative    Negative    Negative     Hepatitis B No results found for: "HEPBSAB", "HEPBSAG", "HEPBCAB" Hepatitis C No results found for: "HEPCAB", "HCVRNAPCRQN" Hepatitis A No results found for: "HAV" Lipids: Lab Results  Component Value Date   CHOL 175 01/07/2020   TRIG 159.0 (H) 01/07/2020   HDL 26.40 (L) 01/07/2020   CHOLHDL 7 01/07/2020   VLDL 31.8 01/07/2020   LDLCALC 117 (H) 01/07/2020    TARGET DATE: The 2nd of the month  Current PrEP Regimen: None  Assessment: Micahel presents today for their first initiation injection of Apretude and to follow up for HIV  PrEP.  Counseled that Apretude is one intramuscular injection in the gluteal muscle for each visit. Explained that the second injection is 30 days after the initial injection then every 2 months thereafter. Discussed follow up appointments moving forward. It is required to have a negative HIV test immediately prior to injection administration. A rapid HIV blood test will be drawn each visit, and patient must wait for results before getting injection, which typically takes 20-30 minutes. Will make lab appointments for each injection 30 minutes prior to injection appointment. Screened patient for acute HIV symptoms such as fatigue, muscle aches, rash, sore throat, lymphadenopathy, headache, night sweats, nausea/vomiting/diarrhea, and fever. Patient denies any symptoms.   Explained that showing up to injection appointments is very important and warned that if appointments are missed, protection will be minimal and the risk of acquiring HIV becomes much higher. Counseled on possible side effects associated with the injections such as injection site pain, which is usually mild to moderate in nature, injection site nodules, and injection site reactions. Asked to call the clinic or send me a mychart message if they experience any issues. Advised that they can take Motrin or Tylenol for injection site pain if needed. They may also pre-treat with Motrin or Tylenol about 30-45 minutes before scheduled appointments.    Per [employer] guidelines, a rapid HIV test should be drawn prior to Apretude administration. Due to state shortage of rapid HIV tests, this is temporarily unable to be done. Per decision from [facility] physicians, we will proceed with Apretude administration at this time without a negative rapid HIV test beforehand. HIV RNA was collected today and is in process.  Administered cabotegravir 600mg /42mL in left upper outer quadrant of the gluteal muscle. Monitored patient for 10 minutes after injection.  Injections were tolerated well without issue.  Will make follow up appointments for second initiation injection in 30 days and then maintenance injections every 2 months thereafter.   Completed PrEP flowsheet for patient; he is only sexually active with males and participates in oral/insertive/receptive sex. States he uses condoms 50% of the time and has been with ~10 partners in the last 3 months. Denies any history of STIs. Answered several questions about general PrEP care. Will check HIV RNA today along with baseline hepatitis A/C serologies. Baseline hepatitis B serologies have already been assessed per Dr. Luciana Axe (HBV sAb +, HBV sAg -). Patient politely declines any STI testing or condoms today.   Plan: - First Apretude injection administered - Second initiation injection scheduled for 5/30  with me  - Check HIV RNA and hepatitis A/C serologies - Call with any issues or questions  Margarite Gouge, PharmD, CPP, BCIDP, AAHIVP Clinical Pharmacist Practitioner Infectious Diseases Clinical Pharmacist [facility]

## 2023-03-11 LAB — HEPATITIS A ANTIBODY, TOTAL: Hepatitis A AB,Total: REACTIVE — AB

## 2023-03-12 LAB — HIV-1 RNA QUANT-NO REFLEX-BLD
HIV 1 RNA Quant: NOT DETECTED Copies/mL
HIV-1 RNA Quant, Log: NOT DETECTED Log cps/mL

## 2023-03-12 LAB — HEPATITIS C ANTIBODY: Hepatitis C Ab: NONREACTIVE

## 2023-03-17 ENCOUNTER — Other Ambulatory Visit (HOSPITAL_COMMUNITY): Payer: Self-pay

## 2023-03-28 ENCOUNTER — Other Ambulatory Visit (HOSPITAL_COMMUNITY): Payer: Self-pay

## 2023-03-29 ENCOUNTER — Other Ambulatory Visit (HOSPITAL_COMMUNITY): Payer: Self-pay

## 2023-03-30 ENCOUNTER — Other Ambulatory Visit: Payer: Self-pay

## 2023-03-30 ENCOUNTER — Other Ambulatory Visit (HOSPITAL_COMMUNITY): Payer: Self-pay

## 2023-03-31 ENCOUNTER — Telehealth: Payer: Self-pay

## 2023-03-31 NOTE — Telephone Encounter (Signed)
[facility] Patient Advocate Encounter  Patient's medication (Apretude) have been couriered to [facility] from [facility] and will be administered on the patient next office visit on 04/07/23.  Clearance Coots , CPhT Specialty Pharmacy Patient [facility] for [facility] Phone: [phone] Fax:  [phone]

## 2023-04-06 NOTE — Progress Notes (Unsigned)
HPI: Mitchell Ray is a 42 y.o. male who presents to the [facility] clinic for Apretude administration and HIV PrEP follow up.  Insured   [x]    Uninsured  []    Patient Active Problem List   Diagnosis Date Noted   High risk sexual behavior 01/20/2023   Urine stream spraying 01/07/2020   Tobacco use 01/07/2020   Healthcare maintenance 01/07/2020   Bipolar disease, chronic (HCC) 01/07/2020   Need for influenza vaccination 01/07/2020   Acquired hypothyroidism 01/07/2020   Cervical myelopathy (HCC) 03/30/2017   Spastic gait 03/30/2017    Patient's Medications  New Prescriptions   No medications on file  Previous Medications   CABOTEGRAVIR ER (APRETUDE) 600 MG/3ML INJECTION    Inject 3 mLs (600 mg total) into the muscle every 30 (thirty) days.   CABOTEGRAVIR ER (APRETUDE) 600 MG/3ML INJECTION    Inject 3 mLs (600 mg total) into the muscle every 2 (two) months.   CALCIUM ELEMENTAL AS CARBONATE (BARIATRIC TUMS ULTRA) 400 MG CHEWABLE TABLET    Chew 3 tablets by mouth at bedtime.    CAPLYTA 42 MG CAPSULE    one capsule (42 mg dose).   IBUPROFEN (ADVIL) 200 MG TABLET    Take 400 mg by mouth daily as needed for headache or moderate pain.   LAMOTRIGINE (LAMICTAL) 100 MG TABLET    Take 100 mg by mouth 2 (two) times daily.    LEVOTHYROXINE (SYNTHROID) 100 MCG TABLET    Take 1 tablet (100 mcg total) by mouth daily before breakfast.   LITHIUM CARBONATE (ESKALITH) 450 MG CR TABLET    Take 900 mg by mouth daily.   Modified Medications   No medications on file  Discontinued Medications   No medications on file    Allergies: Allergies  Allergen Reactions   Dilaudid [Hydromorphone Hcl] Itching   Adhesive [Tape] Rash    Past Medical History: Past Medical History:  Diagnosis Date   ADHD (attention deficit hyperactivity disorder)    Bipolar 2 disorder (HCC)    Blind right eye    Sleep apnea     Social History: Social History   Socioeconomic History   Marital status: Significant  Other    Spouse name: Not on file   Number of children: Not on file   Years of education: Not on file   Highest education level: Not on file  Occupational History   Not on file  Tobacco Use   Smoking status: Former    Packs/day: 1.00    Years: 10.00    Additional pack years: 0.00    Total pack years: 10.00    Types: Cigarettes    Quit date: 01/08/2020    Years since quitting: 3.2    Passive exposure: Past   Smokeless tobacco: Never  Vaping Use   Vaping Use: Never used  Substance and Sexual Activity   Alcohol use: Yes    Alcohol/week: 0.0 standard drinks of alcohol    Comment: rarely   Drug use: No   Sexual activity: Not Currently  Other Topics Concern   Not on file  Social History Narrative   He works part-time at [employer]   He is a Physicist, 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    Social Determinants of Health   Financial Resource Strain: Not on file  Food Insecurity: Not on file  Transportation Needs: Not on file  Physical Activity: Not on file  Stress: Not on file  Social Connections: Not on file    Labs: Lab Results  Component Value Date   HIV1RNAQUANT Not Detected 03/10/2023   HIV1RNAQUANT Not Detected 01/20/2023    RPR and STI Lab Results  Component Value Date   LABRPR NON-REACTIVE 01/20/2023    STI Results GC CT  01/20/2023  9:36 AM Negative    Negative    Negative  Negative    Negative    Negative     Hepatitis B No results found for: "HEPBSAB", "HEPBSAG", "HEPBCAB" Hepatitis C Lab Results  Component Value Date   HEPCAB NON-REACTIVE 03/10/2023   Hepatitis A Lab Results  Component Value Date   HAV REACTIVE (A) 03/10/2023   Lipids: Lab Results  Component Value Date   CHOL 175 01/07/2020   TRIG 159.0 (H) 01/07/2020   HDL 26.40 (L) 01/07/2020   CHOLHDL 7 01/07/2020   VLDL 31.8 01/07/2020   LDLCALC 117 (H) 01/07/2020    TARGET DATE: The 2nd  Assessment: Mitchell Ray presents today for his  second Apretude injection and to follow up for HIV PrEP. No issues with past injections.Screened patient for acute HIV symptoms such as fatigue, muscle aches, rash, sore throat, lymphadenopathy, headache, night sweats, nausea/vomiting/diarrhea, and fever. Patient denies any symptoms. No new partners since last visit. Last STI screening was on 01/20/23 and was negative. No known exposures to any STIs since last visit. Politely declines STI testing today. Will obtain hepatitis B panel today.   Per [employer] guidelines, a rapid HIV test should be drawn prior to Apretude administration. Due to state shortage of rapid HIV tests, this is temporarily unable to be done. Per decision from [facility] physicians, we will proceed with Apretude administration at this time without a negative rapid HIV test beforehand. HIV RNA was collected today and is in process.  Administered cabotegravir 600mg /22mL in right upper outer quadrant of the gluteal muscle. Will see him back in 2 months for injection, labs, and HIV PrEP follow up.  He is eligible for multiple vaccinations today, including HPV and Tdap. He is agreeable to receive these today.   Plan: - Apretude injection administered - Administered HPV and Tdap vaccination - HIV RNA today - Next injection, labs, and PrEP follow up appointment scheduled for 06/07/23 with Marchelle Folks - Call with any issues or questions  Irish Elders, PharmD PGY-1 [facility] Pharm[facility] Resident

## 2023-04-07 ENCOUNTER — Ambulatory Visit (INDEPENDENT_AMBULATORY_CARE_PROVIDER_SITE_OTHER): Payer: Managed Care, Other (non HMO) | Admitting: Pharmacist

## 2023-04-07 ENCOUNTER — Other Ambulatory Visit: Payer: Self-pay

## 2023-04-07 DIAGNOSIS — Z113 Encounter for screening for infections with a predominantly sexual mode of transmission: Secondary | ICD-10-CM

## 2023-04-07 DIAGNOSIS — Z23 Encounter for immunization: Secondary | ICD-10-CM | POA: Diagnosis not present

## 2023-04-07 DIAGNOSIS — Z2981 Encounter for HIV pre-exposure prophylaxis: Secondary | ICD-10-CM

## 2023-04-07 MED ORDER — CABOTEGRAVIR ER 600 MG/3ML IM SUER
600.0000 mg | Freq: Once | INTRAMUSCULAR | Status: AC
Start: 2023-04-07 — End: 2023-04-07
  Administered 2023-04-07: 600 mg via INTRAMUSCULAR

## 2023-04-09 LAB — HIV-1 RNA QUANT-NO REFLEX-BLD
HIV 1 RNA Quant: NOT DETECTED Copies/mL
HIV-1 RNA Quant, Log: NOT DETECTED Log cps/mL

## 2023-04-09 LAB — HEPATITIS B SURFACE ANTIGEN: Hepatitis B Surface Ag: NONREACTIVE

## 2023-04-09 LAB — HEPATITIS B SURFACE ANTIBODY,QUALITATIVE: Hep B S Ab: REACTIVE — AB

## 2023-05-05 ENCOUNTER — Telehealth: Payer: Self-pay

## 2023-05-05 NOTE — Transitions of Care (Post Inpatient/ED Visit) (Signed)
   05/05/2023  Name: Yousaf Sainato MRN: [id number] DOB: 03-06-1981  No longer PCP- opened in error   Woodfin Ganja LPN [facility] Nurse Health Advisor Direct Dial [phone]

## 2023-05-26 ENCOUNTER — Other Ambulatory Visit: Payer: Self-pay

## 2023-05-26 ENCOUNTER — Other Ambulatory Visit (HOSPITAL_COMMUNITY): Payer: Self-pay

## 2023-05-31 ENCOUNTER — Other Ambulatory Visit (HOSPITAL_COMMUNITY): Payer: Self-pay

## 2023-06-02 ENCOUNTER — Telehealth: Payer: Self-pay

## 2023-06-02 NOTE — Telephone Encounter (Signed)
[facility] Patient Advocate Encounter  Patient's medication (Apretude) have been couriered to [facility] from [facility] and will be administered on the patient next office visit on 06/07/23.  Clearance Coots , CPhT Specialty Pharmacy Patient [facility] for Infectious Disease Phone: [phone] Fax:  [phone]

## 2023-06-07 ENCOUNTER — Ambulatory Visit: Payer: Managed Care, Other (non HMO) | Admitting: Pharmacist

## 2023-06-07 ENCOUNTER — Other Ambulatory Visit: Payer: Self-pay

## 2023-06-07 DIAGNOSIS — Z2981 Encounter for HIV pre-exposure prophylaxis: Secondary | ICD-10-CM | POA: Diagnosis not present

## 2023-06-07 DIAGNOSIS — Z23 Encounter for immunization: Secondary | ICD-10-CM | POA: Diagnosis not present

## 2023-06-07 MED ORDER — CABOTEGRAVIR ER 600 MG/3ML IM SUER
600.0000 mg | Freq: Once | INTRAMUSCULAR | Status: AC
Start: 2023-06-07 — End: 2023-06-07
  Administered 2023-06-07: 600 mg via INTRAMUSCULAR

## 2023-06-07 NOTE — Progress Notes (Signed)
HPI: Mitchell Ray is a 42 y.o. male who presents to the [facility] pharmacy clinic for Apretude administration and HIV PrEP follow up.  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   CALCIUM ELEMENTAL AS CARBONATE (BARIATRIC TUMS ULTRA) 400 MG CHEWABLE TABLET    Chew 3 tablets by mouth at bedtime.    CAPLYTA 42 MG CAPSULE    one capsule (42 mg dose).   IBUPROFEN (ADVIL) 200 MG TABLET    Take 400 mg by mouth daily as needed for headache or moderate pain.   LAMOTRIGINE (LAMICTAL) 100 MG TABLET    Take 100 mg by mouth 2 (two) times daily.    LEVOTHYROXINE (SYNTHROID) 100 MCG TABLET    Take 1 tablet (100 mcg total) by mouth daily before breakfast.   LITHIUM CARBONATE (ESKALITH) 450 MG CR TABLET    Take 900 mg by mouth daily.   Modified Medications   No medications on file  Discontinued Medications   No medications on file    Allergies: Allergies  Allergen Reactions   Dilaudid [Hydromorphone Hcl] Itching   Adhesive [Tape] Rash    Past Medical History: Past Medical History:  Diagnosis Date   ADHD (attention deficit hyperactivity disorder)    Bipolar 2 disorder (HCC)    Blind right eye    Sleep apnea     Social History: Social History   Socioeconomic History   Marital status: Single    Spouse name: Not on file   Number of children: Not on file   Years of education: Not on file   Highest education level: Not on file  Occupational  History   Not on file  Tobacco Use   Smoking status: Former    Current packs/day: 0.00    Average packs/day: 1 pack/day for 10.0 years (10.0 ttl pk-yrs)    Types: Cigarettes    Start date: 01/07/2010    Quit date: 01/08/2020    Years since quitting: 3.4    Passive exposure: Past   Smokeless tobacco: Never  Vaping Use   Vaping status: Never Used  Substance and Sexual Activity   Alcohol use: Yes    Alcohol/week: 0.0 standard drinks of alcohol    Comment: rarely   Drug use: No   Sexual activity: Not Currently  Other Topics Concern   Not on file  Social History Narrative   He works part-time at [employer]   He is a Physicist, 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    Social Determinants of Health   Financial Resource Strain: Not on file  Food Insecurity: No Food Insecurity (05/03/2023)   Received from [facility], [facility]   Hunger Vital Sign    Worried About Running Out of Food in the Last Year: Never true    Ran Out of Food in the Last Year: Never true  Transportation Needs: No Transportation Needs (05/03/2023)   [facility]   PRAPARE - Transportation    Lack of Transportation (Medical): No    Lack of Transportation (Non-Medical): No  Physical Activity: Not on file  Stress: No Stress Concern Present (05/03/2023)   Received from [facility], [facility] of Occupational Health - Occupational Stress Questionnaire    Feeling of Stress : Not at all  Social Connections: Unknown (03/14/2022)   Received from [id number], [facility]   Social Network    Social Network: Not on file    Labs: Lab Results  Component Value Date   HIV1RNAQUANT Not Detected 04/07/2023   HIV1RNAQUANT Not Detected 03/10/2023   HIV1RNAQUANT Not Detected 01/20/2023    RPR and STI Lab Results  Component Value Date   LABRPR NON-REACTIVE 01/20/2023    STI Results GC CT  01/20/2023  9:36 AM Negative     Negative    Negative  Negative    Negative    Negative     Hepatitis B Lab Results  Component Value Date   HEPBSAB REACTIVE (A) 04/07/2023   HEPBSAG NON-REACTIVE 04/07/2023   Hepatitis C Lab Results  Component Value Date   HEPCAB NON-REACTIVE 03/10/2023   Hepatitis A Lab Results  Component Value Date   HAV REACTIVE (A) 03/10/2023   Lipids: Lab Results  Component Value Date   CHOL 175 01/07/2020   TRIG 159.0 (H) 01/07/2020   HDL 26.40 (L) 01/07/2020   CHOLHDL 7 01/07/2020   VLDL 31.8 01/07/2020   LDLCALC 117 (H) 01/07/2020    TARGET DATE: The 2nd of the month  Current PrEP Regimen: Apretude  Assessment: Koree presents today for their Apretude injection and to follow up for HIV PrEP. No issues with past injections. No known exposures to any STIs and no signs or symptoms of any STIs today. Last STI screening was in May and was negative. Screened patient for acute HIV symptoms such as fatigue, muscle aches, rash, sore throat, lymphadenopathy, headache, night sweats, nausea/vomiting/diarrhea, and fever. Patient denies any symptoms. No new partners since last injection; will defer STI testing today.   Per [employer] guidelines, a rapid HIV test should be drawn prior to Apretude administration. Due to state shortage of rapid HIV tests, this is temporarily unable to be done. Per decision from [facility] physicians, we will proceed with Apretude administration at this time without a negative rapid HIV test beforehand. HIV RNA was collected today and is in process.  Administered cabotegravir 600mg /60mL in right upper outer quadrant of the gluteal muscle. Will make follow up appointments for maintenance injections every 2 months.   Due for second HPV vaccine which was administered; third due in November.   Plan: - Maintenance injections scheduled for 9/25 and 11/26 with me  - Check HIV RNA  - Administer HPV 2/3  - Call with any issues or questions  Margarite Gouge, PharmD, CPP,  BCIDP, AAHIVP Clinical Pharmacist Practitioner Infectious Diseases Clinical Pharmacist [facility]

## 2023-07-25 ENCOUNTER — Other Ambulatory Visit (HOSPITAL_COMMUNITY): Payer: Self-pay

## 2023-07-28 ENCOUNTER — Telehealth: Payer: Self-pay

## 2023-07-28 NOTE — Telephone Encounter (Signed)
[facility] Patient Advocate Encounter  Patient's medication (Apretude) have been couriered to [facility] from [facility] and will be administered on the patient next office visit on 08/03/23.  Clearance Coots , CPhT Specialty Pharmacy Patient [facility] for Infectious Disease Phone: [phone] Fax:  [phone]

## 2023-08-03 ENCOUNTER — Other Ambulatory Visit: Payer: Self-pay

## 2023-08-03 ENCOUNTER — Ambulatory Visit: Payer: Managed Care, Other (non HMO) | Admitting: Pharmacist

## 2023-08-03 DIAGNOSIS — Z23 Encounter for immunization: Secondary | ICD-10-CM | POA: Diagnosis not present

## 2023-08-03 DIAGNOSIS — Z79899 Other long term (current) drug therapy: Secondary | ICD-10-CM

## 2023-08-03 DIAGNOSIS — Z2981 Encounter for HIV pre-exposure prophylaxis: Secondary | ICD-10-CM

## 2023-08-03 MED ORDER — CABOTEGRAVIR ER 600 MG/3ML IM SUER
600.0000 mg | Freq: Once | INTRAMUSCULAR | Status: AC
Start: 2023-08-03 — End: 2023-08-03
  Administered 2023-08-03: 600 mg via INTRAMUSCULAR

## 2023-08-03 NOTE — Progress Notes (Signed)
HPI: Mitchell Ray is a 42 y.o. male who presents to the [facility] pharmacy clinic for Apretude administration and HIV PrEP follow up.  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   CALCIUM ELEMENTAL AS CARBONATE (BARIATRIC TUMS ULTRA) 400 MG CHEWABLE TABLET    Chew 3 tablets by mouth at bedtime.    CAPLYTA 42 MG CAPSULE    one capsule (42 mg dose).   IBUPROFEN (ADVIL) 200 MG TABLET    Take 400 mg by mouth daily as needed for headache or moderate pain.   LAMOTRIGINE (LAMICTAL) 100 MG TABLET    Take 100 mg by mouth 2 (two) times daily.    LEVOTHYROXINE (SYNTHROID) 100 MCG TABLET    Take 1 tablet (100 mcg total) by mouth daily before breakfast.   LITHIUM CARBONATE (ESKALITH) 450 MG CR TABLET    Take 900 mg by mouth daily.   Modified Medications   No medications on file  Discontinued Medications   No medications on file    Allergies: Allergies  Allergen Reactions   Dilaudid [Hydromorphone Hcl] Itching   Adhesive [Tape] Rash    Past Medical History: Past Medical History:  Diagnosis Date   ADHD (attention deficit hyperactivity disorder)    Bipolar 2 disorder (HCC)    Blind right eye    Sleep apnea     Social History: Social History   Socioeconomic History   Marital status: Single    Spouse name: Not on file   Number of children: Not on file   Years of education: Not on file   Highest education level: Not on file  Occupational  History   Not on file  Tobacco Use   Smoking status: Former    Current packs/day: 0.00    Average packs/day: 1 pack/day for 10.0 years (10.0 ttl pk-yrs)    Types: Cigarettes    Start date: 01/07/2010    Quit date: 01/08/2020    Years since quitting: 3.5    Passive exposure: Past   Smokeless tobacco: Never  Vaping Use   Vaping status: Never Used  Substance and Sexual Activity   Alcohol use: Yes    Alcohol/week: 0.0 standard drinks of alcohol    Comment: rarely   Drug use: No   Sexual activity: Not Currently  Other Topics Concern   Not on file  Social History Narrative   He works part-time at [employer]   He is a Physicist, 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    Social Determinants of Health   Financial Resource Strain: Not on file  Food Insecurity: No Food Insecurity (05/03/2023)   Received from [facility], [facility]   Hunger Vital Sign    Worried About Running Out of Food in the Last Year: Never true    Ran Out of Food in the Last Year: Never true  Transportation Needs: No Transportation Needs (05/03/2023)   [facility]   PRAPARE - Transportation    Lack of Transportation (Medical): No    Lack of Transportation (Non-Medical): No  Physical Activity: Not on file  Stress: No Stress Concern Present (05/03/2023)   Received from [facility], [facility] of Occupational Health - Occupational Stress Questionnaire    Feeling of Stress : Not at all  Social Connections: Unknown (03/14/2022)   Received from [facility], [facility]   Social Network    Social Network: Not on file    Labs: Lab Results  Component Value Date   HIV1RNAQUANT Not Detected 06/07/2023   HIV1RNAQUANT Not Detected 04/07/2023   HIV1RNAQUANT Not Detected 03/10/2023    RPR and STI Lab Results  Component Value Date   LABRPR NON-REACTIVE 01/20/2023    STI Results GC CT  01/20/2023  9:36 AM Negative     Negative    Negative  Negative    Negative    Negative     Hepatitis B Lab Results  Component Value Date   HEPBSAB REACTIVE (A) 04/07/2023   HEPBSAG NON-REACTIVE 04/07/2023   Hepatitis C Lab Results  Component Value Date   HEPCAB NON-REACTIVE 03/10/2023   Hepatitis A Lab Results  Component Value Date   HAV REACTIVE (A) 03/10/2023   Lipids: Lab Results  Component Value Date   CHOL 175 01/07/2020   TRIG 159.0 (H) 01/07/2020   HDL 26.40 (L) 01/07/2020   CHOLHDL 7 01/07/2020   VLDL 31.8 01/07/2020   LDLCALC 117 (H) 01/07/2020    TARGET DATE: The 2nd of the month  Assessment: Mitchell Ray presents today for their Apretude injection and to follow up for HIV PrEP. No issues with past injections.  Screened patient for acute HIV symptoms such as fatigue, muscle aches, rash, sore throat, lymphadenopathy, headache, night sweats, nausea/vomiting/diarrhea, and fever. Patient denies any symptoms.   Per [employer] guidelines, a rapid HIV test should be drawn prior to Apretude administration. Due to state shortage of rapid HIV tests, this is temporarily unable to be done. Per decision from [facility] physicians, we will proceed with Apretude administration at this time without a negative rapid HIV test beforehand. HIV RNA was collected today and is in process.  Administered cabotegravir 600mg /58mL in right upper outer quadrant of the gluteal muscle. Will make follow up appointments for maintenance injections every 2 months.   No known exposures to any STIs and no signs or symptoms of any STIs today. Last STI screening was in March and was negative. No new sexual partners since last injection; will defer STI testing today.   Due for COVID and flu vaccines which were both administered. Due for third and final HPV vaccine at next visit.   Plan: - Administer Apretude 600 mg x 1  - Maintenance injections scheduled for 11/26 with me  - Check HIV RNA  - Administer flu and COVID vaccine - Call with  any issues or questions  Margarite Gouge, PharmD, CPP, BCIDP, AAHIVP Clinical Pharmacist Practitioner Infectious Diseases Clinical Pharmacist [facility]

## 2023-08-06 LAB — HIV-1 RNA QUANT-NO REFLEX-BLD
HIV 1 RNA Quant: NOT DETECTED {copies}/mL
HIV-1 RNA Quant, Log: NOT DETECTED {Log}

## 2023-09-14 ENCOUNTER — Other Ambulatory Visit (HOSPITAL_COMMUNITY): Payer: Self-pay

## 2023-09-14 ENCOUNTER — Other Ambulatory Visit: Payer: Self-pay

## 2023-09-14 NOTE — Progress Notes (Signed)
Specialty Pharmacy Refill Coordination Note  Mitchell Ray is a 43 y.o. male assessed today regarding refills of clinic administered specialty medication(s) Cabotegravir   Clinic requested Courier to Provider Office   Delivery date: 09/28/23   Verified address: [facility] [address]  [place] [address]   Medication will be filled on 09/27/23.

## 2023-09-28 ENCOUNTER — Telehealth: Payer: Self-pay

## 2023-09-28 NOTE — Telephone Encounter (Signed)
[facility] Patient Advocate Encounter  Patient's medications APRETUDE have been couriered to [facility] from [facility] and will be administered at the patients appointment on 10/04/23.  Kae Heller, CPhT Specialty Pharmacy Patient [facility] for Infectious Disease Phone: [phone] Fax:  [phone]

## 2023-10-03 NOTE — Progress Notes (Unsigned)
HPI: Mitchell Ray is a 42 y.o. male who presents to the [facility] pharmacy clinic for Apretude administration and HIV PrEP follow up.  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   CALCIUM ELEMENTAL AS CARBONATE (BARIATRIC TUMS ULTRA) 400 MG CHEWABLE TABLET    Chew 3 tablets by mouth at bedtime.    CAPLYTA 42 MG CAPSULE    one capsule (42 mg dose).   IBUPROFEN (ADVIL) 200 MG TABLET    Take 400 mg by mouth daily as needed for headache or moderate pain.   LAMOTRIGINE (LAMICTAL) 100 MG TABLET    Take 100 mg by mouth 2 (two) times daily.    LEVOTHYROXINE (SYNTHROID) 100 MCG TABLET    Take 1 tablet (100 mcg total) by mouth daily before breakfast.   LITHIUM CARBONATE (ESKALITH) 450 MG CR TABLET    Take 900 mg by mouth daily.   Modified Medications   No medications on file  Discontinued Medications   No medications on file    Allergies: Allergies  Allergen Reactions   Dilaudid [Hydromorphone Hcl] Itching   Adhesive [Tape] Rash    Labs: Lab Results  Component Value Date   HIV1RNAQUANT Not Detected 08/03/2023   HIV1RNAQUANT Not Detected 06/07/2023   HIV1RNAQUANT Not Detected 04/07/2023    RPR and STI Lab Results  Component Value Date   LABRPR NON-REACTIVE 01/20/2023    STI Results GC CT  01/20/2023  9:36 AM Negative    Negative    Negative  Negative    Negative    Negative     Hepatitis B Lab  Results  Component Value Date   HEPBSAB REACTIVE (A) 04/07/2023   HEPBSAG NON-REACTIVE 04/07/2023   Hepatitis C Lab Results  Component Value Date   HEPCAB NON-REACTIVE 03/10/2023   Hepatitis A Lab Results  Component Value Date   HAV REACTIVE (A) 03/10/2023   Lipids: Lab Results  Component Value Date   CHOL 175 01/07/2020   TRIG 159.0 (H) 01/07/2020   HDL 26.40 (L) 01/07/2020   CHOLHDL 7 01/07/2020   VLDL 31.8 01/07/2020   LDLCALC 117 (H) 01/07/2020    TARGET DATE: The 2nd of the month  Assessment: Mitchell Ray presents today for his Apretude injection and to follow up for HIV PrEP. No issues with past injections. Denies any symptoms of acute HIV. Last STI screening was on 3/14/243 and was negative. *** known exposures to any STIs since last visit. *** agrees to full STI testing today with RPR and oral/urine/rectal cytologies.   Per [employer] guidelines, a rapid HIV test should be drawn prior to Apretude administration. Due to state shortage of rapid HIV tests, this is temporarily unable to be done. Per decision from [facility] physicians, we will proceed with Apretude administration at this time without a negative rapid HIV test beforehand. HIV RNA was collected today and is in process.  Administered cabotegravir 600mg /78mL in *** upper outer quadrant of the gluteal muscle. Will see Mitchell Ray back in 2 months for injection, labs, and HIV PrEP follow up.  Due for final HPV vaccine today. Mitchell Ray *** this today.  Plan: - Apretude injection administered - HIV RNA today - STI testing: *** - Immunizations: *** - Next injection, labs, and PrEP follow up appointment scheduled for *** - Call with any issues or questions  Lora Paula, PharmD PGY-2 Infectious Diseases Pharmacy Resident [facility] for [facility]

## 2023-10-04 ENCOUNTER — Ambulatory Visit: Payer: Managed Care, Other (non HMO) | Admitting: Pharmacist

## 2023-10-13 NOTE — Progress Notes (Signed)
HPI: Mitchell Ray is a 42 y.o. male who presents to the [facility] pharmacy clinic for Apretude administration and HIV PrEP follow up.  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   CALCIUM ELEMENTAL AS CARBONATE (BARIATRIC TUMS ULTRA) 400 MG CHEWABLE TABLET    Chew 3 tablets by mouth at bedtime.    CAPLYTA 42 MG CAPSULE    one capsule (42 mg dose).   IBUPROFEN (ADVIL) 200 MG TABLET    Take 400 mg by mouth daily as needed for headache or moderate pain.   LAMOTRIGINE (LAMICTAL) 100 MG TABLET    Take 100 mg by mouth 2 (two) times daily.    LEVOTHYROXINE (SYNTHROID) 100 MCG TABLET    Take 1 tablet (100 mcg total) by mouth daily before breakfast.   LITHIUM CARBONATE (ESKALITH) 450 MG CR TABLET    Take 900 mg by mouth daily.   Modified Medications   No medications on file  Discontinued Medications   No medications on file    Allergies: Allergies  Allergen Reactions   Dilaudid [Hydromorphone Hcl] Itching   Adhesive [Tape] Rash    Past Medical History: Past Medical History:  Diagnosis Date   ADHD (attention deficit hyperactivity disorder)    Bipolar 2 disorder (HCC)    Blind right eye    Sleep apnea     Social History: Social History   Socioeconomic History   Marital status: Single    Spouse name: Not on file   Number of children: Not on file   Years of education: Not on file   Highest education level: Not on file  Occupational  History   Not on file  Tobacco Use   Smoking status: Former    Current packs/day: 0.00    Average packs/day: 1 pack/day for 10.0 years (10.0 ttl pk-yrs)    Types: Cigarettes    Start date: 01/07/2010    Quit date: 01/08/2020    Years since quitting: 3.7    Passive exposure: Past   Smokeless tobacco: Never  Vaping Use   Vaping status: Never Used  Substance and Sexual Activity   Alcohol use: Yes    Alcohol/week: 0.0 standard drinks of alcohol    Comment: rarely   Drug use: No   Sexual activity: Not Currently  Other Topics Concern   Not on file  Social History Narrative   He works part-time at [employer]   He is a Physicist, 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    Social Determinants of Health   Financial Resource Strain: Not on file  Food Insecurity: No Food Insecurity (05/03/2023)   Received from [facility], [facility]   Hunger Vital Sign    Worried About Running Out of Food in the Last Year: Never true    Ran Out of Food in the Last Year: Never true  Transportation Needs: No Transportation Needs (05/03/2023)   [facility]   PRAPARE - Transportation    Lack of Transportation (Medical): No    Lack of Transportation (Non-Medical): No  Physical Activity: Not on file  Stress: No Stress Concern Present (05/03/2023)   Received from [facility], [facility] of Occupational Health - Occupational Stress Questionnaire    Feeling of Stress : Not at all  Social Connections: Unknown (03/14/2022)   Received from [facility], [facility]   Social Network    Social Network: Not on file    Labs: Lab Results  Component Value Date   HIV1RNAQUANT Not Detected 08/03/2023   HIV1RNAQUANT Not Detected 06/07/2023   HIV1RNAQUANT Not Detected 04/07/2023    RPR and STI Lab Results  Component Value Date   LABRPR NON-REACTIVE 01/20/2023    STI Results GC CT  01/20/2023  9:36 AM Negative     Negative    Negative  Negative    Negative    Negative     Hepatitis B Lab Results  Component Value Date   HEPBSAB REACTIVE (A) 04/07/2023   HEPBSAG NON-REACTIVE 04/07/2023   Hepatitis C Lab Results  Component Value Date   HEPCAB NON-REACTIVE 03/10/2023   Hepatitis A Lab Results  Component Value Date   HAV REACTIVE (A) 03/10/2023   Lipids: Lab Results  Component Value Date   CHOL 175 01/07/2020   TRIG 159.0 (H) 01/07/2020   HDL 26.40 (L) 01/07/2020   CHOLHDL 7 01/07/2020   VLDL 31.8 01/07/2020   LDLCALC 117 (H) 01/07/2020    TARGET DATE: The 2nd of the month  Assessment: Mitchell Ray presents today for their Apretude injection and to follow up for HIV PrEP. No issues with past injections.  Screened patient for acute HIV symptoms such as fatigue, muscle aches, rash, sore throat, lymphadenopathy, headache, night sweats, nausea/vomiting/diarrhea, and fever. Patient denies any symptoms.   Per [employer] guidelines, a rapid HIV test should be drawn prior to Apretude administration. Due to state shortage of rapid HIV tests, this is temporarily unable to be done. Per decision from [facility] physicians, we will proceed with Apretude administration at this time without a negative rapid HIV test beforehand. HIV RNA was collected today and is in process.  Administered cabotegravir 600mg /19mL in right upper outer quadrant of the gluteal muscle. Will make follow up appointments for maintenance injections every 2 months.   No known exposures to any STIs and no signs or symptoms of any STIs today. Last STI screening was in March and was negative. States he only participated in oral sex recently; will check oral cytologies today. Also administered third and final HPV vaccination today.   Plan: - Administer Apretude 600 mg x 1  - Maintenance injections scheduled for 1/31 with me  - Check HIV RNA and oral cytologies - Administer 3/3 HPV  - Call with any issues or questions  Margarite Gouge,  PharmD, CPP, BCIDP, AAHIVP Clinical Pharmacist Practitioner Infectious Diseases Clinical Pharmacist [facility]

## 2023-10-14 ENCOUNTER — Other Ambulatory Visit: Payer: Self-pay

## 2023-10-14 ENCOUNTER — Ambulatory Visit (INDEPENDENT_AMBULATORY_CARE_PROVIDER_SITE_OTHER): Payer: Managed Care, Other (non HMO) | Admitting: Pharmacist

## 2023-10-14 DIAGNOSIS — Z23 Encounter for immunization: Secondary | ICD-10-CM

## 2023-10-14 DIAGNOSIS — Z113 Encounter for screening for infections with a predominantly sexual mode of transmission: Secondary | ICD-10-CM

## 2023-10-14 DIAGNOSIS — Z79899 Other long term (current) drug therapy: Secondary | ICD-10-CM

## 2023-10-14 DIAGNOSIS — Z2981 Encounter for HIV pre-exposure prophylaxis: Secondary | ICD-10-CM

## 2023-10-14 MED ORDER — CABOTEGRAVIR ER 600 MG/3ML IM SUER
600.0000 mg | Freq: Once | INTRAMUSCULAR | Status: AC
Start: 2023-10-14 — End: 2023-10-14
  Administered 2023-10-14: 600 mg via INTRAMUSCULAR

## 2023-10-17 LAB — HIV-1 RNA QUANT-NO REFLEX-BLD
HIV 1 RNA Quant: NOT DETECTED {copies}/mL
HIV-1 RNA Quant, Log: NOT DETECTED {Log_copies}/mL

## 2023-10-17 LAB — GC/CHLAMYDIA PROBE, AMP (THROAT)
Chlamydia trachomatis RNA: NOT DETECTED
Neisseria gonorrhoeae RNA: NOT DETECTED

## 2023-11-03 ENCOUNTER — Other Ambulatory Visit (HOSPITAL_COMMUNITY): Payer: Self-pay

## 2023-11-22 ENCOUNTER — Other Ambulatory Visit (HOSPITAL_COMMUNITY): Payer: Self-pay

## 2023-11-22 ENCOUNTER — Other Ambulatory Visit: Payer: Self-pay

## 2023-11-22 NOTE — Progress Notes (Signed)
 Specialty Pharmacy Refill Coordination Note  Mitchell Ray is a 43 y.o. male assessed today regarding refills of clinic administered specialty medication(s) Cabotegravir  (Apretude )   Clinic requested Courier to Provider Office   Delivery date: 11/30/23   Verified address: [address] [place] [address]   Medication will be filled on 11/29/23.

## 2023-11-29 ENCOUNTER — Other Ambulatory Visit: Payer: Self-pay

## 2023-11-30 ENCOUNTER — Telehealth: Payer: Self-pay

## 2023-11-30 NOTE — Telephone Encounter (Signed)
[facility] Patient Advocate Encounter  Patient's medications APRETUDE have been couriered to [facility] from [facility] pharmacy and will be administered at the patients appointment on 12/08/23.  Kae Heller, CPhT Specialty Pharmacy Patient [facility] for Infectious Disease Phone: [phone] Fax:  [phone]

## 2023-12-08 ENCOUNTER — Other Ambulatory Visit (HOSPITAL_COMMUNITY): Payer: Self-pay

## 2023-12-09 ENCOUNTER — Other Ambulatory Visit: Payer: Self-pay

## 2023-12-09 ENCOUNTER — Ambulatory Visit (INDEPENDENT_AMBULATORY_CARE_PROVIDER_SITE_OTHER): Payer: 59 | Admitting: Pharmacist

## 2023-12-09 DIAGNOSIS — Z2981 Encounter for HIV pre-exposure prophylaxis: Secondary | ICD-10-CM

## 2023-12-09 DIAGNOSIS — Z79899 Other long term (current) drug therapy: Secondary | ICD-10-CM

## 2023-12-09 DIAGNOSIS — Z113 Encounter for screening for infections with a predominantly sexual mode of transmission: Secondary | ICD-10-CM

## 2023-12-09 MED ORDER — CABOTEGRAVIR ER 600 MG/3ML IM SUER
600.0000 mg | Freq: Once | INTRAMUSCULAR | Status: AC
  Administered 2023-12-09: 600 mg via INTRAMUSCULAR

## 2023-12-09 NOTE — Progress Notes (Signed)
HPI: Mitchell Ray is a 43 y.o. male who presents to the [facility] pharmacy clinic for Apretude administration and HIV PrEP follow up.  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   CALCIUM ELEMENTAL AS CARBONATE (BARIATRIC TUMS ULTRA) 400 MG CHEWABLE TABLET    Chew 3 tablets by mouth at bedtime.    CAPLYTA 42 MG CAPSULE    one capsule (42 mg dose).   IBUPROFEN (ADVIL) 200 MG TABLET    Take 400 mg by mouth daily as needed for headache or moderate pain.   LAMOTRIGINE (LAMICTAL) 100 MG TABLET    Take 100 mg by mouth 2 (two) times daily.    LEVOTHYROXINE (SYNTHROID) 100 MCG TABLET    Take 1 tablet (100 mcg total) by mouth daily before breakfast.   LITHIUM CARBONATE (ESKALITH) 450 MG CR TABLET    Take 900 mg by mouth daily.   Modified Medications   No medications on file  Discontinued Medications   No medications on file    Allergies: Allergies  Allergen Reactions   Dilaudid [Hydromorphone Hcl] Itching   Adhesive [Tape] Rash    Labs: Lab Results  Component Value Date   HIV1RNAQUANT Not Detected 10/14/2023   HIV1RNAQUANT Not Detected 08/03/2023   HIV1RNAQUANT Not Detected 06/07/2023    RPR and STI Lab Results  Component Value Date   LABRPR NON-REACTIVE 01/20/2023    STI Results GC CT  01/20/2023  9:36 AM Negative    Negative    Negative  Negative    Negative    Negative     Hepatitis B Lab  Results  Component Value Date   HEPBSAB REACTIVE (A) 04/07/2023   HEPBSAG NON-REACTIVE 04/07/2023   Hepatitis C Lab Results  Component Value Date   HEPCAB NON-REACTIVE 03/10/2023   Hepatitis A Lab Results  Component Value Date   HAV REACTIVE (A) 03/10/2023   Lipids: Lab Results  Component Value Date   CHOL 175 01/07/2020   TRIG 159.0 (H) 01/07/2020   HDL 26.40 (L) 01/07/2020   CHOLHDL 7 01/07/2020   VLDL 31.8 01/07/2020   LDLCALC 117 (H) 01/07/2020    TARGET DATE: 2nd day of the month  Assessment: Mitchell Ray presents today for his Apretude injection and to follow up for HIV PrEP. No issues with past injections. Denies any symptoms of acute HIV. Last STI screening was on 10/14/23 and was negative for chlamydia/gonorrhea with oral cytology. Reports sexual encounter with new partner since last visit. Denies signs and symptoms of STI. Agrees to STI testing today with RPR and oral/urine cytologies.   Vaccination: Up to date.   Per [employer] guidelines, a rapid HIV test should be drawn prior to Apretude administration. Due to state shortage of rapid HIV tests, this is temporarily unable to be done. Per decision from [facility] physicians, we will proceed with Apretude administration at this time without a  negative rapid HIV test beforehand. HIV RNA was collected today and is in process.  Administered cabotegravir 600mg /58mL in right upper outer quadrant of the gluteal muscle. Will see him back in 2 months for injection, labs, and HIV PrEP follow up.  Plan: - Apretude injection administered - HIV RNA today - RPR and oral/urine cytologies - Next injection, labs, and PrEP follow up appointment scheduled for 02/03/24 - Call with any issues or questions  Janene Harvey V. Edwena Blow, PharmD Candidate [facility] [employer]

## 2023-12-10 LAB — C. TRACHOMATIS/N. GONORRHOEAE RNA
C. trachomatis RNA, TMA: NOT DETECTED
N. gonorrhoeae RNA, TMA: NOT DETECTED

## 2023-12-10 LAB — GC/CHLAMYDIA PROBE, AMP (THROAT)
Chlamydia trachomatis RNA: NOT DETECTED
Neisseria gonorrhoeae RNA: NOT DETECTED

## 2023-12-13 LAB — RPR: RPR Ser Ql: NONREACTIVE

## 2023-12-13 LAB — HIV-1 RNA QUANT-NO REFLEX-BLD
HIV 1 RNA Quant: NOT DETECTED {copies}/mL
HIV-1 RNA Quant, Log: NOT DETECTED {Log}

## 2024-01-16 ENCOUNTER — Other Ambulatory Visit (HOSPITAL_COMMUNITY): Payer: Self-pay

## 2024-01-16 ENCOUNTER — Other Ambulatory Visit: Payer: Self-pay

## 2024-01-16 NOTE — Progress Notes (Signed)
 Specialty Pharmacy Refill Coordination Note  Carron Jaggi is a 43 y.o. male assessed today regarding refills of clinic administered specialty medication(s) Cabotegravir (Apretude)   Clinic requested Courier to Provider Office   Delivery date: 01/30/24   Verified address: [address] [place] [address]   Medication will be filled on 01/27/24.

## 2024-01-27 ENCOUNTER — Other Ambulatory Visit: Payer: Self-pay

## 2024-01-30 ENCOUNTER — Telehealth: Payer: Self-pay

## 2024-01-30 NOTE — Telephone Encounter (Signed)
 [facility] Patient Advocate Encounter  Patient's medications Apretude have been couriered to [facility] from [facility] pharmacy and will be administered at the patients appointment on 02/03/24.  Clearance Coots, CPhT Specialty Pharmacy Patient [facility] for Infectious Disease Phone: [phone] Fax:  [phone]

## 2024-01-30 NOTE — Progress Notes (Unsigned)
 HPI: Mitchell Ray is a 43 y.o. male who presents to the [facility] pharmacy clinic for Apretude administration and HIV PrEP follow up.  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   CALCIUM ELEMENTAL AS CARBONATE (BARIATRIC TUMS ULTRA) 400 MG CHEWABLE TABLET    Chew 3 tablets by mouth at bedtime.    CAPLYTA 42 MG CAPSULE    one capsule (42 mg dose).   IBUPROFEN (ADVIL) 200 MG TABLET    Take 400 mg by mouth daily as needed for headache or moderate pain.   LAMOTRIGINE (LAMICTAL) 100 MG TABLET    Take 100 mg by mouth 2 (two) times daily.    LEVOTHYROXINE (SYNTHROID) 100 MCG TABLET    Take 1 tablet (100 mcg total) by mouth daily before breakfast.   LITHIUM CARBONATE (ESKALITH) 450 MG CR TABLET    Take 900 mg by mouth daily.   Modified Medications   No medications on file  Discontinued Medications   No medications on file    Allergies: Allergies  Allergen Reactions   Dilaudid [Hydromorphone Hcl] Itching   Adhesive [Tape] Rash    Past Medical History: Past Medical History:  Diagnosis Date   ADHD (attention deficit hyperactivity disorder)    Bipolar 2 disorder (HCC)    Blind right eye    Sleep apnea     Social History: Social History   Socioeconomic History   Marital status: Single    Spouse name: Not on file   Number of children: Not on file   Years of education: Not on file   Highest education level: Not on file  Occupational  History   Not on file  Tobacco Use   Smoking status: Former    Current packs/day: 0.00    Average packs/day: 1 pack/day for 10.0 years (10.0 ttl pk-yrs)    Types: Cigarettes    Start date: 01/07/2010    Quit date: 01/08/2020    Years since quitting: 4.0    Passive exposure: Past   Smokeless tobacco: Never  Vaping Use   Vaping status: Never Used  Substance and Sexual Activity   Alcohol use: Yes    Alcohol/week: 0.0 standard drinks of alcohol    Comment: rarely   Drug use: No   Sexual activity: Not Currently  Other Topics Concern   Not on file  Social History Narrative   He works part-time at [employer]   He is a Physicist, 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    Social Drivers of Corporate investment banker Strain: Not on file  Food Insecurity: No Food Insecurity (05/03/2023)   Received from [facility], [facility]   Hunger Vital Sign    Worried About Running Out of Food in the Last Year: Never true    Ran Out of Food in the Last Year: Never true  Transportation Needs: No Transportation Needs (05/03/2023)   [facility]   PRAPARE - Transportation    Lack of Transportation (Medical): No    Lack of Transportation (Non-Medical): No  Physical Activity: Not on file  Stress: No Stress Concern Present (05/03/2023)   Received from [facility], [facility] of Occupational Health - Occupational Stress Questionnaire    Feeling of Stress : Not at all  Social Connections: Unknown (03/14/2022)   Received from [facility], [facility]   Social Network    Social Network: Not on file    Labs: Lab Results  Component Value Date   HIV1RNAQUANT Not Detected 12/09/2023   HIV1RNAQUANT Not Detected 10/14/2023   HIV1RNAQUANT Not Detected 08/03/2023    RPR and STI Lab Results  Component Value Date   LABRPR NON-REACTIVE 12/09/2023   LABRPR NON-REACTIVE 01/20/2023    STI Results GC CT   01/20/2023  9:36 AM Negative    Negative    Negative  Negative    Negative    Negative     Hepatitis B Lab Results  Component Value Date   HEPBSAB REACTIVE (A) 04/07/2023   HEPBSAG NON-REACTIVE 04/07/2023   Hepatitis C Lab Results  Component Value Date   HEPCAB NON-REACTIVE 03/10/2023   Hepatitis A Lab Results  Component Value Date   HAV REACTIVE (A) 03/10/2023   Lipids: Lab Results  Component Value Date   CHOL 175 01/07/2020   TRIG 159.0 (H) 01/07/2020   HDL 26.40 (L) 01/07/2020   CHOLHDL 7 01/07/2020   VLDL 31.8 01/07/2020   LDLCALC 117 (H) 01/07/2020    TARGET DATE: The 2nd of the month  Assessment: Mitchell Ray presents today for their Apretude injection and to follow up for HIV PrEP. No issues with past injections.  Screened patient for acute HIV symptoms such as fatigue, muscle aches, rash, sore throat, lymphadenopathy, headache, night sweats, nausea/vomiting/diarrhea, and fever. Patient denies any symptoms.   Per [employer] guidelines, a rapid HIV test should be drawn prior to Apretude administration. Due to state shortage of rapid HIV tests, this is temporarily unable to be done. Per decision from [facility] physicians, we will proceed with Apretude administration at this time without a negative rapid HIV test beforehand. HIV RNA was collected today and is in process.  Administered cabotegravir 600mg /81mL in right upper outer quadrant of the gluteal muscle. Will make follow up appointments for maintenance injections every 2 months.   No known exposures to any STIs and no signs or symptoms of any STIs today. Last STI screening was in January and was negative. Has been active with several new partners since last injection; will check RPR and urine/oral/rectal cytologies today.   Plan: - Administer Apretude 600 mg x 1  - Maintenance injections scheduled for 5/30 and 7/29 with me  - Check HIV RNA, RPR, and urine/oral/rectal cytologies - Call with any issues or  questions  Margarite Gouge, PharmD, CPP, BCIDP, AAHIVP Clinical Pharmacist Practitioner Infectious Diseases Clinical Pharmacist [facility]

## 2024-02-03 ENCOUNTER — Other Ambulatory Visit: Payer: Self-pay

## 2024-02-03 ENCOUNTER — Ambulatory Visit (INDEPENDENT_AMBULATORY_CARE_PROVIDER_SITE_OTHER): Payer: 59 | Admitting: Pharmacist

## 2024-02-03 DIAGNOSIS — Z2981 Encounter for HIV pre-exposure prophylaxis: Secondary | ICD-10-CM | POA: Diagnosis not present

## 2024-02-03 DIAGNOSIS — Z113 Encounter for screening for infections with a predominantly sexual mode of transmission: Secondary | ICD-10-CM

## 2024-02-03 DIAGNOSIS — Z79899 Other long term (current) drug therapy: Secondary | ICD-10-CM

## 2024-02-03 MED ORDER — CABOTEGRAVIR ER 600 MG/3ML IM SUER
600.0000 mg | Freq: Once | INTRAMUSCULAR | Status: AC
  Administered 2024-02-03: 600 mg via INTRAMUSCULAR

## 2024-02-04 LAB — C. TRACHOMATIS/N. GONORRHOEAE RNA
C. trachomatis RNA, TMA: NOT DETECTED
N. gonorrhoeae RNA, TMA: NOT DETECTED

## 2024-02-04 LAB — CT/NG RNA, TMA RECTAL
Chlamydia Trachomatis RNA: NOT DETECTED
Neisseria Gonorrhoeae RNA: NOT DETECTED

## 2024-02-04 LAB — GC/CHLAMYDIA PROBE, AMP (THROAT)
Chlamydia trachomatis RNA: NOT DETECTED
Neisseria gonorrhoeae RNA: NOT DETECTED

## 2024-02-05 LAB — HIV-1 RNA QUANT-NO REFLEX-BLD
HIV 1 RNA Quant: NOT DETECTED {copies}/mL
HIV-1 RNA Quant, Log: NOT DETECTED {Log_copies}/mL

## 2024-02-05 LAB — RPR: RPR Ser Ql: NONREACTIVE

## 2024-03-09 ENCOUNTER — Other Ambulatory Visit (HOSPITAL_COMMUNITY): Payer: Self-pay

## 2024-03-12 ENCOUNTER — Other Ambulatory Visit: Payer: Self-pay | Admitting: Pharmacist

## 2024-03-12 ENCOUNTER — Other Ambulatory Visit: Payer: Self-pay

## 2024-03-12 ENCOUNTER — Other Ambulatory Visit (HOSPITAL_COMMUNITY): Payer: Self-pay

## 2024-03-12 DIAGNOSIS — Z79899 Other long term (current) drug therapy: Secondary | ICD-10-CM

## 2024-03-12 MED ORDER — APRETUDE 600 MG/3ML IM SUER
600.0000 mg | INTRAMUSCULAR | 5 refills | Status: AC
  Filled 2024-03-12: qty 3, 60d supply, fill #0
  Filled 2024-05-24: qty 3, 60d supply, fill #1
  Filled 2024-07-25: qty 3, 60d supply, fill #2
  Filled 2024-09-20: qty 3, 60d supply, fill #3
  Filled 2024-11-16: qty 3, 60d supply, fill #4

## 2024-03-12 NOTE — Progress Notes (Signed)
 Specialty Pharmacy Refill Coordination Note  Mitchell Ray is a 43 y.o. male assessed today regarding refills of clinic administered specialty medication(s) Cabotegravir  (Apretude )   Clinic requested Courier to Provider Office   Delivery date: 04/02/24   Verified address: [address] [place] [address]   Medication will be filled on 03/30/24.

## 2024-03-30 ENCOUNTER — Other Ambulatory Visit: Payer: Self-pay

## 2024-04-03 ENCOUNTER — Telehealth: Payer: Self-pay

## 2024-04-03 NOTE — Telephone Encounter (Signed)
 [facility] Patient Advocate Encounter  Patient's medications APRETUDE  have been couriered to [facility] from [facility] and will be administered at the patients appointment on 04/06/24.  Mitchell Ray, CPhT Specialty Pharmacy Patient Mitchell Ray [facility] for Infectious Disease Phone: [phone] Fax:  [phone]

## 2024-04-05 NOTE — Progress Notes (Unsigned)
 HPI: Mitchell Ray is a 43 y.o. male who presents to the [facility] pharmacy clinic for Apretude  administration and HIV PrEP follow up.  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   CALCIUM ELEMENTAL AS CARBONATE (BARIATRIC TUMS ULTRA) 400 MG CHEWABLE TABLET    Chew 3 tablets by mouth at bedtime.    CAPLYTA 42 MG CAPSULE    one capsule (42 mg dose).   IBUPROFEN  (ADVIL ) 200 MG TABLET    Take 400 mg by mouth daily as needed for headache or moderate pain.   LAMOTRIGINE (LAMICTAL) 100 MG TABLET    Take 100 mg by mouth 2 (two) times daily.    LEVOTHYROXINE  (SYNTHROID ) 100 MCG TABLET    Take 1 tablet (100 mcg total) by mouth daily before breakfast.   LITHIUM CARBONATE (ESKALITH) 450 MG CR TABLET    Take 900 mg by mouth daily.   Modified Medications   No medications on file  Discontinued Medications   No medications on file    Allergies: Allergies  Allergen Reactions   Dilaudid [Hydromorphone Hcl] Itching   Adhesive [Tape] Rash    Labs: Lab Results  Component Value Date   HIV1RNAQUANT NOT DETECTED 02/03/2024   HIV1RNAQUANT Not Detected 12/09/2023   HIV1RNAQUANT Not Detected 10/14/2023    RPR and STI Lab Results  Component Value Date   LABRPR NON-REACTIVE 02/03/2024   LABRPR NON-REACTIVE 12/09/2023   LABRPR NON-REACTIVE 01/20/2023    STI Results GC CT  01/20/2023  9:36 AM Negative    Negative     Negative  Negative    Negative    Negative     Hepatitis B Lab Results  Component Value Date   HEPBSAB REACTIVE (A) 04/07/2023   HEPBSAG NON-REACTIVE 04/07/2023   Hepatitis C Lab Results  Component Value Date   HEPCAB NON-REACTIVE 03/10/2023   Hepatitis A Lab Results  Component Value Date   HAV REACTIVE (A) 03/10/2023   Lipids: Lab Results  Component Value Date   CHOL 175 01/07/2020   TRIG 159.0 (H) 01/07/2020   HDL 26.40 (L) 01/07/2020   CHOLHDL 7 01/07/2020   VLDL 31.8 01/07/2020   LDLCALC 117 (H) 01/07/2020    TARGET DATE: 2nd of the month  Assessment: Mitchell Ray presents today for his Apretude  injection and to follow up for HIV PrEP. No issues with past injections. Denies any symptoms of acute HIV. Last STI screening was on 02/03/24 and was negative. No known exposures to any STIs since last visit. He defers STI testing today. Up to date on recommended vaccinations.   Per [employer] guidelines, a rapid HIV test should be drawn prior to Apretude  administration. Due to state shortage of rapid HIV tests, this is temporarily unable to be done. Per decision from [facility] physicians, we will proceed with Apretude  administration at this time without a negative rapid HIV test beforehand. HIV RNA  was collected today and is in process.  Administered cabotegravir  600mg /52mL in right upper outer quadrant of the gluteal muscle. Will see Mitchell Ray back in 2 months for injection, labs, and HIV PrEP follow up.  Mitchell Ray does have a trip to [place] coming up this August. Was asking about travel vaccines that may be needed before he goes. Will research his vaccine history and send information to him before his next appointment.   Plan: - Apretude  injection administered - HIV RNA today - Next injection, labs, and PrEP follow up appointment scheduled for 7/29 with Cassie - Call with any issues or questions  Valarie Garner, PharmD PGY1 Pharmacy Resident

## 2024-04-06 ENCOUNTER — Ambulatory Visit (INDEPENDENT_AMBULATORY_CARE_PROVIDER_SITE_OTHER): Admitting: Pharmacist

## 2024-04-06 ENCOUNTER — Other Ambulatory Visit: Payer: Self-pay

## 2024-04-06 DIAGNOSIS — Z2981 Encounter for HIV pre-exposure prophylaxis: Secondary | ICD-10-CM | POA: Diagnosis not present

## 2024-04-06 DIAGNOSIS — Z79899 Other long term (current) drug therapy: Secondary | ICD-10-CM

## 2024-04-06 DIAGNOSIS — Z113 Encounter for screening for infections with a predominantly sexual mode of transmission: Secondary | ICD-10-CM

## 2024-04-06 MED ORDER — CABOTEGRAVIR ER 600 MG/3ML IM SUER
600.0000 mg | Freq: Once | INTRAMUSCULAR | Status: AC
Start: 2024-04-06 — End: 2024-04-06
  Administered 2024-04-06: 600 mg via INTRAMUSCULAR

## 2024-04-08 LAB — HIV-1 RNA QUANT-NO REFLEX-BLD
HIV 1 RNA Quant: NOT DETECTED {copies}/mL
HIV-1 RNA Quant, Log: NOT DETECTED {Log_copies}/mL

## 2024-05-15 ENCOUNTER — Encounter: Payer: Self-pay | Admitting: Pharmacist

## 2024-05-15 ENCOUNTER — Telehealth: Payer: Self-pay | Admitting: Pharmacist

## 2024-05-15 NOTE — Telephone Encounter (Signed)
 Discussed concerns about upcoming trip to [place] and ensuring he is vaccinated appropriately. Referred him to our [facility] travel medicine team.

## 2024-05-15 NOTE — Telephone Encounter (Signed)
 Jakolby LVM requesting to speak with Alan soon. He has a specific question for her directly. Pt can be reached at [phone].

## 2024-05-24 ENCOUNTER — Other Ambulatory Visit: Payer: Self-pay

## 2024-05-24 ENCOUNTER — Other Ambulatory Visit (HOSPITAL_COMMUNITY): Payer: Self-pay

## 2024-05-24 NOTE — Progress Notes (Signed)
 Specialty Pharmacy Refill Coordination Note  Mitchell Ray is a 43 y.o. male assessed today regarding refills of clinic administered specialty medication(s) Cabotegravir  (Apretude )   Clinic requested Courier to Provider Office   Delivery date: 05/30/24   Verified address: [address] [place] [address]   Medication will be filled on 05/29/24.

## 2024-05-29 ENCOUNTER — Other Ambulatory Visit: Payer: Self-pay

## 2024-05-30 ENCOUNTER — Telehealth: Payer: Self-pay

## 2024-05-30 NOTE — Telephone Encounter (Signed)
 [facility] Patient Advocate Encounter  Patient's medications Apretude  have been couriered to [facility] from [facility] and will be administered at the patients appointment on 06/05/24.  Arland Hutchinson, CPhT Specialty Pharmacy Patient [facility] for Infectious Disease Phone: [phone] Fax:  [phone]

## 2024-06-04 NOTE — Progress Notes (Unsigned)
 HPI: Mitchell Ray is a 43 y.o. male who presents to the [facility] pharmacy clinic for Apretude  administration and HIV PrEP follow up.  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   CALCIUM ELEMENTAL AS CARBONATE (BARIATRIC TUMS ULTRA) 400 MG CHEWABLE TABLET    Chew 3 tablets by mouth at bedtime.    CAPLYTA 42 MG CAPSULE    one capsule (42 mg dose).   IBUPROFEN  (ADVIL ) 200 MG TABLET    Take 400 mg by mouth daily as needed for headache or moderate pain.   LAMOTRIGINE (LAMICTAL) 100 MG TABLET    Take 100 mg by mouth 2 (two) times daily.    LEVOTHYROXINE  (SYNTHROID ) 100 MCG TABLET    Take 1 tablet (100 mcg total) by mouth daily before breakfast.   LITHIUM CARBONATE (ESKALITH) 450 MG CR TABLET    Take 900 mg by mouth daily.   Modified Medications   No medications on file  Discontinued Medications   No medications on file    Allergies: Allergies  Allergen Reactions   Dilaudid [Hydromorphone Hcl] Itching   Adhesive [Tape] Rash    Labs: Lab Results  Component Value Date   HIV1RNAQUANT NOT DETECTED 04/06/2024   HIV1RNAQUANT NOT DETECTED 02/03/2024   HIV1RNAQUANT Not Detected 12/09/2023    RPR and STI Lab Results  Component Value Date   LABRPR NON-REACTIVE 02/03/2024   LABRPR NON-REACTIVE 12/09/2023   LABRPR NON-REACTIVE 01/20/2023    STI Results GC CT  01/20/2023  9:36 AM Negative    Negative     Negative  Negative    Negative    Negative     Hepatitis B Lab Results  Component Value Date   HEPBSAB REACTIVE (A) 04/07/2023   HEPBSAG NON-REACTIVE 04/07/2023   Hepatitis C Lab Results  Component Value Date   HEPCAB NON-REACTIVE 03/10/2023   Hepatitis A Lab Results  Component Value Date   HAV REACTIVE (A) 03/10/2023   Lipids: Lab Results  Component Value Date   CHOL 175 01/07/2020   TRIG 159.0 (H) 01/07/2020   HDL 26.40 (L) 01/07/2020   CHOLHDL 7 01/07/2020   VLDL 31.8 01/07/2020   LDLCALC 117 (H) 01/07/2020    TARGET DATE: The 2nd  Assessment: Mitchell Ray presents today for his Apretude  injection and to follow up for HIV PrEP. No issues with past injections. Denies any symptoms of acute HIV. Last HIV RNA was negative on 04/06/24. Last STI screening was on 02/03/24 and was negative. Requesting an oral swab today but declines rectal and urine screenings. He is getting ready to leave for a 2 week trip to [place] and [place]. Currently up to date on all recommended vaccines.   Per [employer] guidelines, a rapid HIV test should be drawn prior to Apretude  administration. Due to state shortage of rapid HIV tests, this is temporarily unable to be done. Per decision from [facility]  physicians, we will proceed with Apretude  administration at this time without a negative rapid HIV test beforehand. HIV RNA was collected today and is in process.  Administered cabotegravir  600mg /61mL in right upper outer quadrant of the gluteal muscle. Will see him back in 2 months for injection, labs, and HIV PrEP follow up.  Plan: - Apretude  injection administered - HIV RNA, RPR, and oral cytology today - Next injection, labs, and PrEP follow up appointment scheduled for 08/03/24 with [facility] - Call with any issues or questions  Saryah Loper L. Teressa Mcglocklin, PharmD, BCIDP, AAHIVP, CPP Clinical Pharmacist Practitioner - Infectious Diseases Clinical Pharmacist Lead - Specialty Pharmacy [facility]  for Infectious Disease

## 2024-06-05 ENCOUNTER — Ambulatory Visit: Admitting: Pharmacist

## 2024-06-05 ENCOUNTER — Other Ambulatory Visit: Payer: Self-pay

## 2024-06-05 ENCOUNTER — Other Ambulatory Visit (HOSPITAL_COMMUNITY)
Admission: RE | Admit: 2024-06-05 | Discharge: 2024-06-05 | Disposition: A | Discharge status: Home / Self Care | Source: Ambulatory Visit | Attending: Infectious Disease | Admitting: Infectious Disease

## 2024-06-05 DIAGNOSIS — Z113 Encounter for screening for infections with a predominantly sexual mode of transmission: Secondary | ICD-10-CM

## 2024-06-05 DIAGNOSIS — Z2981 Encounter for HIV pre-exposure prophylaxis: Secondary | ICD-10-CM | POA: Diagnosis not present

## 2024-06-05 DIAGNOSIS — Z79899 Other long term (current) drug therapy: Secondary | ICD-10-CM

## 2024-06-05 MED ORDER — CABOTEGRAVIR ER 600 MG/3ML IM SUER
600.0000 mg | Freq: Once | INTRAMUSCULAR | Status: AC
Start: 2024-06-05 — End: 2024-06-05
  Administered 2024-06-05: 600 mg via INTRAMUSCULAR

## 2024-06-06 LAB — CYTOLOGY, (ORAL, ANAL, URETHRAL) ANCILLARY ONLY
Chlamydia: NEGATIVE
Comment: NEGATIVE
Comment: NORMAL
Neisseria Gonorrhea: NEGATIVE

## 2024-06-09 LAB — HIV-1 RNA QUANT-NO REFLEX-BLD
HIV 1 RNA Quant: NOT DETECTED {copies}/mL
HIV-1 RNA Quant, Log: NOT DETECTED {Log_copies}/mL

## 2024-06-09 LAB — RPR: RPR Ser Ql: REACTIVE — AB

## 2024-06-09 LAB — RPR TITER: RPR Titer: 1:1 {titer} — ABNORMAL HIGH

## 2024-06-09 LAB — T PALLIDUM AB: T Pallidum Abs: NEGATIVE

## 2024-06-10 ENCOUNTER — Encounter: Payer: Self-pay | Admitting: Pharmacist

## 2024-07-25 ENCOUNTER — Other Ambulatory Visit (HOSPITAL_COMMUNITY): Payer: Self-pay

## 2024-07-25 ENCOUNTER — Other Ambulatory Visit: Payer: Self-pay

## 2024-07-25 NOTE — Progress Notes (Signed)
 Specialty Pharmacy Refill Coordination Note  Mitchell Ray is a 43 y.o. male assessed today regarding refills of clinic administered specialty medication(s) Cabotegravir  (Apretude )   Clinic requested Courier to Provider Office   Delivery date: 07/31/24   Verified address: [address] [place] [address]   Medication will be filled on 07/2224.

## 2024-07-31 ENCOUNTER — Telehealth: Payer: Self-pay

## 2024-07-31 NOTE — Telephone Encounter (Signed)
 [facility] Patient Advocate Encounter  Patient's medications APRETUDE  have been couriered to [facility] from [facility] and will be administered at the patients appointment on 08/03/24.  Charmaine Sharps, CPhT Specialty Pharmacy Patient [facility] for Infectious Disease Phone: [phone] Fax:  [phone]

## 2024-08-01 NOTE — Progress Notes (Unsigned)
 HPI: Mitchell Ray is a 43 y.o. male who presents to the [facility] pharmacy clinic for Apretude  administration and HIV PrEP follow up.  Referring ID Physician: Dr. Fleeta Rothman  Patient Active Problem List   Diagnosis Date Noted   High risk sexual behavior 01/20/2023   Urine stream spraying 01/07/2020   Tobacco use 01/07/2020   Healthcare maintenance 01/07/2020   Bipolar disease, chronic (HCC) 01/07/2020   Need for influenza vaccination 01/07/2020   Acquired hypothyroidism 01/07/2020   Cervical myelopathy (HCC) 03/30/2017   Spastic gait 03/30/2017    Patient's Medications  New Prescriptions   No medications on file  Previous Medications   CABOTEGRAVIR  ER (APRETUDE ) 600 MG/3ML INJECTION    Inject 3 mLs (600 mg total) into the muscle every 2 (two) months.   CALCIUM ELEMENTAL AS CARBONATE (BARIATRIC TUMS ULTRA) 400 MG CHEWABLE TABLET    Chew 3 tablets by mouth at bedtime.    CAPLYTA 42 MG CAPSULE    one capsule (42 mg dose).   IBUPROFEN  (ADVIL ) 200 MG TABLET    Take 400 mg by mouth daily as needed for headache or moderate pain.   LAMOTRIGINE (LAMICTAL) 100 MG TABLET    Take 100 mg by mouth 2 (two) times daily.    LEVOTHYROXINE  (SYNTHROID ) 100 MCG TABLET    Take 1 tablet (100 mcg total) by mouth daily before breakfast.   LITHIUM CARBONATE (ESKALITH) 450 MG CR TABLET    Take 900 mg by mouth daily.   Modified Medications   No medications on file  Discontinued Medications   No medications on file    Allergies: Allergies  Allergen Reactions   Dilaudid [Hydromorphone Hcl] Itching   Adhesive [Tape] Rash    Past Medical History: Past Medical History:  Diagnosis Date   ADHD (attention deficit hyperactivity disorder)    Bipolar 2 disorder (HCC)    Blind right eye    Sleep apnea     Social History: Social History   Socioeconomic History   Marital status: Single    Spouse name: Not on file   Number of children: Not on file   Years of education: Not on file   Highest education  level: Not on file  Occupational History   Not on file  Tobacco Use   Smoking status: Former    Current packs/day: 0.00    Average packs/day: 1 pack/day for 10.0 years (10.0 ttl pk-yrs)    Types: Cigarettes    Start date: 01/07/2010    Quit date: 01/08/2020    Years since quitting: 4.5    Passive exposure: Past   Smokeless tobacco: Never  Vaping Use   Vaping status: Never Used  Substance and Sexual Activity   Alcohol use: Yes    Alcohol/week: 0.0 standard drinks of alcohol    Comment: rarely   Drug use: No   Sexual activity: Not Currently  Other Topics Concern   Not on file  Social History Narrative   He works part-time at [employer]   He is a Physicist, 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    Social Drivers of Corporate investment banker Strain: Not on file  Food Insecurity: No Food Insecurity (05/03/2023)   Received from [facility]   Hunger Vital Sign    Within the past 12 months, you worried that your food would run out before you got the money to buy more.: Never true    Within the past 12 months,  the food you bought just didn't last and you didn't have money to get more.: Never true  Transportation Needs: No Transportation Needs (05/03/2023)   Received from [facility]   PRAPARE - Transportation    Lack of Transportation (Medical): No    Lack of Transportation (Non-Medical): No  Physical Activity: Not on file  Stress: No Stress Concern Present (05/03/2023)   Received from [facility] of Occupational Health - Occupational Stress Questionnaire    Feeling of Stress : Not at all  Social Connections: Unknown (03/14/2022)   Received from [facility]   Social Network    Social Network: Not on file    Labs: Lab Results  Component Value Date   HIV1RNAQUANT NOT DETECTED 06/05/2024   HIV1RNAQUANT NOT DETECTED 04/06/2024   HIV1RNAQUANT NOT DETECTED 02/03/2024    RPR and STI Lab Results  Component Value Date    LABRPR REACTIVE (A) 06/05/2024   LABRPR NON-REACTIVE 02/03/2024   LABRPR NON-REACTIVE 12/09/2023   LABRPR NON-REACTIVE 01/20/2023   RPRTITER 1:1 (H) 06/05/2024    STI Results GC CT  06/05/2024  9:09 AM Negative  Negative   01/20/2023  9:36 AM Negative    Negative    Negative  Negative    Negative    Negative     Hepatitis B Lab Results  Component Value Date   HEPBSAB REACTIVE (A) 04/07/2023   HEPBSAG NON-REACTIVE 04/07/2023   Hepatitis C Lab Results  Component Value Date   HEPCAB NON-REACTIVE 03/10/2023   Hepatitis A Lab Results  Component Value Date   HAV REACTIVE (A) 03/10/2023   Lipids: Lab Results  Component Value Date   CHOL 175 01/07/2020   TRIG 159.0 (H) 01/07/2020   HDL 26.40 (L) 01/07/2020   CHOLHDL 7 01/07/2020   VLDL 31.8 01/07/2020   LDLCALC 117 (H) 01/07/2020    TARGET DATE: The 2nd of the month  Assessment: Chun presents today for their Apretude  injection and to follow up for HIV PrEP. No issues with past injections.  Screened patient for acute HIV symptoms such as fatigue, muscle aches, rash, sore throat, lymphadenopathy, headache, night sweats, nausea/vomiting/diarrhea, and fever. Patient denies any symptoms.   Per [employer] guidelines, a rapid HIV test should be drawn prior to Apretude  administration. Due to state shortage of rapid HIV tests, this is temporarily unable to be done. Per decision from [facility] physicians, we will proceed with Apretude  administration at this time without a negative rapid HIV test beforehand. HIV RNA was collected today and is in process.  Administered cabotegravir  600mg /10mL in right upper outer quadrant of the gluteal muscle. Will make follow up appointments for maintenance injections every 2 months.   No known exposures to any STIs and no signs or symptoms of any STIs today. Last STI screening was in July and was negative. Has been exclusive with one new partners since last injection; will check routine STI  screening today.   Plan: - Administer Apretude  600 mg x 1  - Maintenance injections scheduled for 10/12/24 with me  - Check HIV RNA, RPR, and urine/oral cytologies  - Call with any issues or questions  Alan Geralds, PharmD, CPP, BCIDP, AAHIVP Clinical Pharmacist Practitioner Infectious Diseases Clinical Pharmacist [facility]

## 2024-08-03 ENCOUNTER — Ambulatory Visit: Admitting: Pharmacist

## 2024-08-03 ENCOUNTER — Other Ambulatory Visit: Payer: Self-pay

## 2024-08-03 DIAGNOSIS — Z79899 Other long term (current) drug therapy: Secondary | ICD-10-CM

## 2024-08-03 DIAGNOSIS — Z113 Encounter for screening for infections with a predominantly sexual mode of transmission: Secondary | ICD-10-CM

## 2024-08-03 MED ORDER — CABOTEGRAVIR ER 600 MG/3ML IM SUER
600.0000 mg | Freq: Once | INTRAMUSCULAR | Status: AC
  Administered 2024-08-03: 600 mg via INTRAMUSCULAR

## 2024-08-03 MED ORDER — COVID-19 MRNA VAC-TRIS(PFIZER) 30 MCG/0.3ML IM SUSY
0.3000 mL | PREFILLED_SYRINGE | Freq: Once | INTRAMUSCULAR | 0 refills | Status: AC
  Filled 2024-08-03: qty 0.3, 1d supply, fill #0

## 2024-08-04 LAB — GC/CHLAMYDIA PROBE, AMP (THROAT)
Chlamydia trachomatis RNA: NOT DETECTED
Neisseria gonorrhoeae RNA: NOT DETECTED

## 2024-08-04 LAB — C. TRACHOMATIS/N. GONORRHOEAE RNA
C. trachomatis RNA, TMA: NOT DETECTED
N. gonorrhoeae RNA, TMA: NOT DETECTED

## 2024-08-09 LAB — HIV-1 RNA QUANT-NO REFLEX-BLD
HIV 1 RNA Quant: NOT DETECTED {copies}/mL
HIV-1 RNA Quant, Log: NOT DETECTED {Log_copies}/mL

## 2024-08-09 LAB — RPR: RPR Ser Ql: NONREACTIVE

## 2024-09-20 ENCOUNTER — Other Ambulatory Visit: Payer: Self-pay

## 2024-09-20 ENCOUNTER — Other Ambulatory Visit (HOSPITAL_COMMUNITY): Payer: Self-pay

## 2024-09-20 NOTE — Progress Notes (Signed)
 Specialty Pharmacy Refill Coordination Note  Mitchell Ray is a 43 y.o. male assessed today regarding refills of clinic administered specialty medication(s) Cabotegravir  (Apretude )   Clinic requested Courier to Provider Office   Delivery date: 10/08/24   Verified address: [address] [place] [address]   Medication will be filled on 10/03/24.

## 2024-09-26 ENCOUNTER — Other Ambulatory Visit: Payer: Self-pay

## 2024-10-03 ENCOUNTER — Other Ambulatory Visit (HOSPITAL_COMMUNITY): Payer: Self-pay

## 2024-10-08 ENCOUNTER — Telehealth: Payer: Self-pay

## 2024-10-08 NOTE — Telephone Encounter (Signed)
 [facility] Patient Advocate Encounter  Patient's medications Apretude  have been couriered to [facility] from [facility] and will be administered at the patients appointment on 10/12/24.  Mitchell Ray, CPhT Specialty Pharmacy Patient [facility] for Infectious Disease Phone: [phone] Fax:  [phone]

## 2024-10-11 NOTE — Progress Notes (Signed)
 HPI: Mitchell Ray is a 43 y.o. male who presents to the [facility] pharmacy clinic for Apretude  administration and HIV PrEP follow up.  Insured   [x]    Uninsured  []    Referring ID Physician: Dr. Fleeta Ray  Patient Active Problem List   Diagnosis Date Noted   High risk sexual behavior 01/20/2023   Urine stream spraying 01/07/2020   Tobacco use 01/07/2020   Healthcare maintenance 01/07/2020   Bipolar disease, chronic (HCC) 01/07/2020   Need for influenza vaccination 01/07/2020   Acquired hypothyroidism 01/07/2020   Cervical myelopathy (HCC) 03/30/2017   Spastic gait 03/30/2017    Patient's Medications  New Prescriptions   No medications on file  Previous Medications   CABOTEGRAVIR  ER (APRETUDE ) 600 MG/3ML INJECTION    Inject 3 mLs (600 mg total) into the muscle every 2 (two) months.   CALCIUM ELEMENTAL AS CARBONATE (BARIATRIC TUMS ULTRA) 400 MG CHEWABLE TABLET    Chew 3 tablets by mouth at bedtime.    CAPLYTA 42 MG CAPSULE    one capsule (42 mg dose).   IBUPROFEN  (ADVIL ) 200 MG TABLET    Take 400 mg by mouth daily as needed for headache or moderate pain.   LAMOTRIGINE (LAMICTAL) 100 MG TABLET    Take 100 mg by mouth 2 (two) times daily.    LEVOTHYROXINE  (SYNTHROID ) 100 MCG TABLET    Take 1 tablet (100 mcg total) by mouth daily before breakfast.   LITHIUM CARBONATE (ESKALITH) 450 MG CR TABLET    Take 900 mg by mouth daily.   Modified Medications   No medications on file  Discontinued Medications   No medications on file    Allergies: Allergies  Allergen Reactions   Dilaudid [Hydromorphone Hcl] Itching   Adhesive [Tape] Rash    Past Medical History: Past Medical History:  Diagnosis Date   ADHD (attention deficit hyperactivity disorder)    Bipolar 2 disorder (HCC)    Blind right eye    Sleep apnea     Social History: Social History   Socioeconomic History   Marital status: Single    Spouse name: Not on file   Number of children: Not on file   Years of education:  Not on file   Highest education level: Not on file  Occupational History   Not on file  Tobacco Use   Smoking status: Former    Current packs/day: 0.00    Average packs/day: 1 pack/day for 10.0 years (10.0 ttl pk-yrs)    Types: Cigarettes    Start date: 01/07/2010    Quit date: 01/08/2020    Years since quitting: 4.7    Passive exposure: Past   Smokeless tobacco: Never  Vaping Use   Vaping status: Never Used  Substance and Sexual Activity   Alcohol use: Yes    Alcohol/week: 0.0 standard drinks of alcohol    Comment: rarely   Drug use: No   Sexual activity: Not Currently  Other Topics Concern   Not on file  Social History Narrative   He works part-time at [employer]   He is a physicist, 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    Social Drivers of Corporate Investment Banker Strain: Not on file  Food Insecurity: No Food Insecurity (05/03/2023)   Received from [facility]   Hunger Vital Sign    Within the past 12 months, you worried that your food would run out before you got the money to buy  more.: Never true    Within the past 12 months, the food you bought just didn't last and you didn't have money to get more.: Never true  Transportation Needs: No Transportation Needs (05/03/2023)   Received from [facility]   PRAPARE - Transportation    Lack of Transportation (Medical): No    Lack of Transportation (Non-Medical): No  Physical Activity: Not on file  Stress: No Stress Concern Present (05/03/2023)   Received from [facility] of Occupational Health - Occupational Stress Questionnaire    Feeling of Stress : Not at all  Social Connections: Not on file    Labs: Lab Results  Component Value Date   HIV1RNAQUANT NOT DETECTED 08/03/2024   HIV1RNAQUANT NOT DETECTED 06/05/2024   HIV1RNAQUANT NOT DETECTED 04/06/2024    RPR and STI Lab Results  Component Value Date   LABRPR NON-REACTIVE 08/03/2024   LABRPR REACTIVE  (A) 06/05/2024   LABRPR NON-REACTIVE 02/03/2024   LABRPR NON-REACTIVE 12/09/2023   LABRPR NON-REACTIVE 01/20/2023   RPRTITER 1:1 (H) 06/05/2024    STI Results GC CT  06/05/2024  9:09 AM Negative  Negative   01/20/2023  9:36 AM Negative    Negative    Negative  Negative    Negative    Negative     Hepatitis B Lab Results  Component Value Date   HEPBSAB REACTIVE (A) 04/07/2023   HEPBSAG NON-REACTIVE 04/07/2023   Hepatitis C Lab Results  Component Value Date   HEPCAB NON-REACTIVE 03/10/2023   Hepatitis A Lab Results  Component Value Date   HAV REACTIVE (A) 03/10/2023   Lipids: Lab Results  Component Value Date   CHOL 175 01/07/2020   TRIG 159.0 (H) 01/07/2020   HDL 26.40 (L) 01/07/2020   CHOLHDL 7 01/07/2020   VLDL 31.8 01/07/2020   LDLCALC 117 (H) 01/07/2020    TARGET DATE: The 2nd of the month  Assessment: Mitchell Ray presents today for their Apretude  injection and to follow up for HIV PrEP. No issues with past injections.  Screened patient for acute HIV symptoms such as fatigue, muscle aches, rash, sore throat, lymphadenopathy, headache, night sweats, nausea/vomiting/diarrhea, and fever. Patient denies any symptoms.   Administered cabotegravir  600mg /33mL in right upper outer quadrant of the gluteal muscle. Will make follow up appointments for maintenance injections every 2 months.   No known exposures to any STIs and no signs or symptoms of any STIs today. Remains exclusive with same partner since last injection; politely declines STI testing today.   Discussed Yeztugo for PrEP. Counseled patient that they will need to complete an oral loading dose. Counseled that patient will take two Sunlenca 300 mg tablets (600 mg total) orally on the first day of their injection and will take two Sunlenca 300 mg tablets (600 mg total) orally the next day regardless of meals. Counseled patient that Sunlenca is two separate subcutaneous injections in the abdomen every 6 months.  Reviewed that the main side effects are injection-site soreness and nodules. Discussed measures for relief including cold packs and over-the-counter anti-inflammatories. Will plan to transition to this at next appointment as long as insurance covers appropriately.   Remains eligible for flu vaccine. Patient feeling under the weather today but will plan on administering at next appointment.   Plan: - Administer Apretude  600 mg x 1  - Check HIV RNA  - Investigate Yeztugo insurance coverage - Follow-up with me on 12/14/24 - Call with any issues or questions  Alan Geralds, PharmD, CPP, BCIDP, AAHIVP  Clinical Pharmacist Practitioner Infectious Diseases Clinical Pharmacist [facility]

## 2024-10-12 ENCOUNTER — Other Ambulatory Visit (HOSPITAL_COMMUNITY): Payer: Self-pay

## 2024-10-12 ENCOUNTER — Ambulatory Visit: Admitting: Pharmacist

## 2024-10-12 ENCOUNTER — Other Ambulatory Visit: Payer: Self-pay

## 2024-10-12 ENCOUNTER — Encounter: Payer: Self-pay | Admitting: Pharmacist

## 2024-10-12 DIAGNOSIS — Z2981 Encounter for HIV pre-exposure prophylaxis: Secondary | ICD-10-CM

## 2024-10-12 DIAGNOSIS — Z79899 Other long term (current) drug therapy: Secondary | ICD-10-CM | POA: Diagnosis not present

## 2024-10-12 MED ORDER — CABOTEGRAVIR ER 600 MG/3ML IM SUER
600.0000 mg | Freq: Once | INTRAMUSCULAR | Status: AC
  Administered 2024-10-12: 600 mg via INTRAMUSCULAR

## 2024-10-12 MED ORDER — YEZTUGO 463.5 MG/1.5ML ~~LOC~~ SOLN: 927.0000 mg | SUBCUTANEOUS | 1 refills | Status: DC

## 2024-10-12 MED ORDER — YEZTUGO 300 MG PO TABS: 600.0000 mg | ORAL_TABLET | Freq: Every day | ORAL | 0 refills | Status: AC

## 2024-10-15 ENCOUNTER — Other Ambulatory Visit (HOSPITAL_COMMUNITY): Payer: Self-pay

## 2024-10-15 ENCOUNTER — Telehealth: Payer: Self-pay

## 2024-10-15 NOTE — Telephone Encounter (Signed)
 Submitted a Prior Authorization request to [employer] for [employer] Tablets via [employer]. Will update once we receive a response.  Pharmacy Benefits  PA ID: [id number]

## 2024-10-17 ENCOUNTER — Other Ambulatory Visit (HOSPITAL_COMMUNITY): Payer: Self-pay

## 2024-10-17 LAB — HIV-1 RNA QUANT-NO REFLEX-BLD
HIV 1 RNA Quant: NOT DETECTED {copies}/mL
HIV-1 RNA Quant, Log: NOT DETECTED {Log_copies}/mL

## 2024-10-18 ENCOUNTER — Other Ambulatory Visit (HOSPITAL_COMMUNITY): Payer: Self-pay

## 2024-10-23 ENCOUNTER — Other Ambulatory Visit: Payer: Self-pay

## 2024-10-23 ENCOUNTER — Telehealth: Payer: Self-pay

## 2024-10-23 NOTE — Telephone Encounter (Signed)
 Pharmacy Patient Advocate Encounter   Received notification from [employer] that prior authorization for Yeztugo  Injection is required/requested.   Insurance verification completed.   The patient is insured through [employer].   Per test claim: PA required; PA submitted to above mentioned insurance via [employer] Key/confirmation #/[facility] Status is pending

## 2024-10-23 NOTE — Telephone Encounter (Signed)
 Any word on the injections yet? Thank you Charmaine!

## 2024-10-23 NOTE — Telephone Encounter (Signed)
 Received notification from [employer] regarding a prior authorization for YEZTUGO  TABS. Authorization has been APPROVED from 10/20/24 to 11/07/2198.   Per test claim, copay for 15 days supply is $0  Patient can fill through [facility]: [phone]   Authorization # [id number] Phone # [phone]

## 2024-10-24 ENCOUNTER — Encounter: Payer: Self-pay | Admitting: Pharmacist

## 2024-10-24 ENCOUNTER — Other Ambulatory Visit (HOSPITAL_COMMUNITY): Payer: Self-pay

## 2024-10-24 NOTE — Telephone Encounter (Addendum)
 Pharmacy Patient Advocate Encounter- Yeztugo  BIV-Pharmacy Benefit:  PA was submitted to [employer] and has been approved through: 11/02/24 - 11/07/2198 Authorization# [id number]  Please send prescription to Specialty Pharmacy: [facility]: [phone]  Estimated Copay is: $0

## 2024-10-24 NOTE — Telephone Encounter (Signed)
 Sent patient MyChart and will see what he says!

## 2024-11-12 NOTE — Telephone Encounter (Signed)
 Patient hasn't seen MyChart message yet. Attempted to call today but unable to LVM as VM box full. Will try again later this week. Patient has potential drug interaction between Caplyta and Yeztugo  which needs to be discussed before transition.

## 2024-11-16 ENCOUNTER — Other Ambulatory Visit (HOSPITAL_COMMUNITY): Payer: Self-pay

## 2024-11-16 ENCOUNTER — Other Ambulatory Visit: Payer: Self-pay

## 2024-11-16 NOTE — Progress Notes (Signed)
 Initial fill has been scheduled in [place].

## 2024-11-16 NOTE — Telephone Encounter (Signed)
 Will leave responsibility on patient to reach out to us  about PrEP plans. Will plan to continue Apretude  at next injection as we have not heard from him. GLENWOOD Palma

## 2024-11-16 NOTE — Progress Notes (Signed)
 Specialty Pharmacy Refill Coordination Note  Mitchell Ray is a 44 y.o. male contacted today regarding refills of specialty medication(s) Cabotegravir  (Apretude )   Patient requested Courier to Provider Office   Delivery date: 11/21/24   Verified address: [facility] [address] [place] [address]   Medication will be filled on: 11/19/24

## 2024-11-19 ENCOUNTER — Other Ambulatory Visit: Payer: Self-pay

## 2024-11-20 ENCOUNTER — Telehealth: Payer: Self-pay

## 2024-11-20 NOTE — Telephone Encounter (Signed)
 [facility] Patient Advocate Encounter  Patient's medications APRETUDE  have been couriered to [facility] from [facility] and will be administered at the patients appointment on 12/14/24.  Charmaine Sharps, CPhT Specialty Pharmacy Patient [facility] for Infectious Disease Phone: [phone] Fax:  [phone]

## 2024-12-04 ENCOUNTER — Other Ambulatory Visit: Payer: Self-pay

## 2024-12-04 ENCOUNTER — Other Ambulatory Visit (HOSPITAL_COMMUNITY): Payer: Self-pay

## 2024-12-07 NOTE — Progress Notes (Unsigned)
 "  HPI: Mitchell Ray is a 44 y.o. male who presents to the [facility] pharmacy clinic for Apretude  administration and HIV PrEP follow up.  Insured   [x]    Uninsured  []    Referring ID Physician: Dr. Fleeta Rothman   Patient Active Problem List   Diagnosis Date Noted   High risk sexual behavior 01/20/2023   Urine stream spraying 01/07/2020   Tobacco use 01/07/2020   Healthcare maintenance 01/07/2020   Bipolar disease, chronic (HCC) 01/07/2020   Need for influenza vaccination 01/07/2020   Acquired hypothyroidism 01/07/2020   Cervical myelopathy (HCC) 03/30/2017   Spastic gait 03/30/2017    Patient's Medications  New Prescriptions   No medications on file  Previous Medications   CABOTEGRAVIR  ER (APRETUDE ) 600 MG/3ML INJECTION    Inject 3 mLs (600 mg total) into the muscle every 2 (two) months.   CALCIUM ELEMENTAL AS CARBONATE (BARIATRIC TUMS ULTRA) 400 MG CHEWABLE TABLET    Chew 3 tablets by mouth at bedtime.    CAPLYTA 42 MG CAPSULE    one capsule (42 mg dose).   IBUPROFEN  (ADVIL ) 200 MG TABLET    Take 400 mg by mouth daily as needed for headache or moderate pain.   LAMOTRIGINE (LAMICTAL) 100 MG TABLET    Take 100 mg by mouth 2 (two) times daily.    LEVOTHYROXINE  (SYNTHROID ) 100 MCG TABLET    Take 1 tablet (100 mcg total) by mouth daily before breakfast.   LITHIUM CARBONATE (ESKALITH) 450 MG CR TABLET    Take 900 mg by mouth daily.    SQ INJECTION LENACAPAVIR  (YEZTUGO ) 463.5 MG/1.5ML SQ INJECTION    Inject 3 mLs (927 mg total) into the skin every 6 (six) months. Administer each injection subcutaneously at separate sites in the abdomen (more or equal to 2 inches from the navel).  Modified Medications   No medications on file  Discontinued Medications   No medications on file    Allergies: Allergies[1]  Past Medical History: Past Medical History:  Diagnosis Date   ADHD (attention deficit hyperactivity disorder)    Bipolar 2 disorder (HCC)    Blind right eye    Sleep apnea      Social History: Social History   Socioeconomic History   Marital status: Single    Spouse name: Not on file   Number of children: Not on file   Years of education: Not on file   Highest education level: Not on file  Occupational History   Not on file  Tobacco Use   Smoking status: Former    Current packs/day: 0.00    Average packs/day: 1 pack/day for 10.0 years (10.0 ttl pk-yrs)    Types: Cigarettes    Start date: 01/07/2010    Quit date: 01/08/2020    Years since quitting: 4.9    Passive exposure: Past   Smokeless tobacco: Never  Vaping Use   Vaping status: Never Used  Substance and Sexual Activity   Alcohol use: Yes    Alcohol/week: 0.0 standard drinks of alcohol    Comment: rarely   Drug use: No   Sexual activity: Not Currently  Other Topics Concern   Not on file  Social History Narrative   He works part-time at [employer]   He is a physicist, 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    Social Drivers of Health   Tobacco Use: Low Risk (05/09/2023)   Received from [facility]   Patient History  Passive Exposure: Not on file    Smoking Tobacco Use: Never    Smokeless Tobacco Use: Never  Financial Resource Strain: Not on file  Food Insecurity: No Food Insecurity (05/03/2023)   Received from [facility]   Epic    Within the past 12 months, you worried that your food would run out before you got the money to buy more.: Never true    Within the past 12 months, the food you bought just didn't last and you didn't have money to get more.: Never true  Transportation Needs: No Transportation Needs (05/03/2023)   Received from [facility] - Transportation    Lack of Transportation (Medical): No    Lack of Transportation (Non-Medical): No  Physical Activity: Not on file  Stress: No Stress Concern Present (05/03/2023)   Received from [facility] - Occupational Stress Questionnaire     Feeling of Stress : Not at all  Social Connections: Not on file  Depression (PHQ2-9): Low Risk (01/20/2023)   Depression (PHQ2-9)    PHQ-2 Score: 0  Alcohol Screen: Not on file  Housing: Not on file  Utilities: Not At Risk (05/03/2023)   Received from [facility]    Threatened with loss of utilities: No  Health Literacy: Not on file    Labs: Lab Results  Component Value Date   HIV1RNAQUANT NOT DETECTED 10/12/2024   HIV1RNAQUANT NOT DETECTED 08/03/2024   HIV1RNAQUANT NOT DETECTED 06/05/2024    RPR and STI Lab Results  Component Value Date   LABRPR NON-REACTIVE 08/03/2024   LABRPR REACTIVE (A) 06/05/2024   LABRPR NON-REACTIVE 02/03/2024   LABRPR NON-REACTIVE 12/09/2023   LABRPR NON-REACTIVE 01/20/2023   RPRTITER 1:1 (H) 06/05/2024    STI Results GC CT  06/05/2024  9:09 AM Negative  Negative   01/20/2023  9:36 AM Negative    Negative    Negative  Negative    Negative    Negative     Hepatitis B Lab Results  Component Value Date   HEPBSAB REACTIVE (A) 04/07/2023   HEPBSAG NON-REACTIVE 04/07/2023   Hepatitis C Lab Results  Component Value Date   HEPCAB NON-REACTIVE 03/10/2023   Hepatitis A Lab Results  Component Value Date   HAV REACTIVE (A) 03/10/2023   Lipids: Lab Results  Component Value Date   CHOL 175 01/07/2020   TRIG 159.0 (H) 01/07/2020   HDL 26.40 (L) 01/07/2020   CHOLHDL 7 01/07/2020   VLDL 31.8 01/07/2020   LDLCALC 117 (H) 01/07/2020    TARGET DATE: The 2nd of the month  Assessment: Mitchell Ray presents today for their Apretude  injection and to follow up for HIV PrEP. No issues with past injections.  Screened patient for acute HIV symptoms such as fatigue, muscle aches, rash, sore throat, lymphadenopathy, headache, night sweats, nausea/vomiting/diarrhea, and fever. Patient denies any symptoms.   Administered cabotegravir  600mg /55mL in right upper outer quadrant of the gluteal muscle. Will make follow up appointments for  maintenance injections every 2 months.   No known exposures to any STIs and no signs or symptoms of any STIs today. Last STI screening was in September 2025 and was negative. Recently out of relationship with exclusive partner. No new partners since last injection; politely declines today.   Yeztugo  approved through insurance. Due to drug interaction with Caplyta, he is not interested in Yeztugo  or the dose reduction. Will continue with Apretude .   Plan: - Administer Apretude  600 mg  x 1  - Maintenance injections scheduled for 3/27 with me  - Check HIV RNA - Call with any issues or questions  Alan Geralds, PharmD, CPP, BCIDP, AAHIVP Clinical Pharmacist Practitioner Infectious Diseases Clinical Pharmacist [facility]     [1]  Allergies Allergen Reactions   Dilaudid [Hydromorphone Hcl] Itching   Adhesive [Tape] Rash   "

## 2024-12-14 ENCOUNTER — Other Ambulatory Visit: Payer: Self-pay

## 2024-12-14 ENCOUNTER — Ambulatory Visit: Admitting: Pharmacist

## 2024-12-14 DIAGNOSIS — Z79899 Other long term (current) drug therapy: Secondary | ICD-10-CM

## 2024-12-14 DIAGNOSIS — Z113 Encounter for screening for infections with a predominantly sexual mode of transmission: Secondary | ICD-10-CM

## 2024-12-14 MED ORDER — CABOTEGRAVIR ER 600 MG/3ML IM SUER
600.0000 mg | Freq: Once | INTRAMUSCULAR | Status: AC
  Administered 2024-12-14: 600 mg via INTRAMUSCULAR

## 2025-02-01 ENCOUNTER — Ambulatory Visit: Payer: Self-pay | Admitting: Pharmacist
